# Patient Record
Sex: Male | Born: 1955 | Race: Black or African American | Hispanic: No | Marital: Married | Smoking: Former smoker
Health system: Southern US, Community
[De-identification: ages and names within clinical notes are randomized; demographics above are authoritative.]

## PROBLEM LIST (undated history)

## (undated) DIAGNOSIS — M109 Gout, unspecified: Secondary | ICD-10-CM

## (undated) DIAGNOSIS — J45909 Unspecified asthma, uncomplicated: Secondary | ICD-10-CM

## (undated) DIAGNOSIS — K449 Diaphragmatic hernia without obstruction or gangrene: Secondary | ICD-10-CM

## (undated) DIAGNOSIS — J189 Pneumonia, unspecified organism: Secondary | ICD-10-CM

## (undated) DIAGNOSIS — I739 Peripheral vascular disease, unspecified: Secondary | ICD-10-CM

## (undated) DIAGNOSIS — C801 Malignant (primary) neoplasm, unspecified: Secondary | ICD-10-CM

## (undated) DIAGNOSIS — R0600 Dyspnea, unspecified: Secondary | ICD-10-CM

## (undated) DIAGNOSIS — I4891 Unspecified atrial fibrillation: Secondary | ICD-10-CM

## (undated) DIAGNOSIS — M1990 Unspecified osteoarthritis, unspecified site: Secondary | ICD-10-CM

## (undated) HISTORY — PX: HEMORRHOID SURGERY: SHX153

---

## 2019-08-07 ENCOUNTER — Other Ambulatory Visit: Payer: Self-pay

## 2019-08-07 ENCOUNTER — Encounter (HOSPITAL_BASED_OUTPATIENT_CLINIC_OR_DEPARTMENT_OTHER): Payer: Self-pay | Admitting: Emergency Medicine

## 2019-08-07 ENCOUNTER — Emergency Department (HOSPITAL_BASED_OUTPATIENT_CLINIC_OR_DEPARTMENT_OTHER)
Admission: EM | Admit: 2019-08-07 | Discharge: 2019-08-08 | Disposition: A | Discharge status: Home / Self Care | Payer: Medicare Other | Attending: Emergency Medicine | Admitting: Emergency Medicine

## 2019-08-07 ENCOUNTER — Emergency Department (HOSPITAL_BASED_OUTPATIENT_CLINIC_OR_DEPARTMENT_OTHER): Payer: Medicare Other

## 2019-08-07 DIAGNOSIS — R05 Cough: Secondary | ICD-10-CM | POA: Diagnosis not present

## 2019-08-07 DIAGNOSIS — F17290 Nicotine dependence, other tobacco product, uncomplicated: Secondary | ICD-10-CM | POA: Diagnosis not present

## 2019-08-07 DIAGNOSIS — R0602 Shortness of breath: Secondary | ICD-10-CM | POA: Diagnosis present

## 2019-08-07 DIAGNOSIS — R059 Cough, unspecified: Secondary | ICD-10-CM

## 2019-08-07 DIAGNOSIS — J9801 Acute bronchospasm: Secondary | ICD-10-CM

## 2019-08-07 HISTORY — DX: Unspecified asthma, uncomplicated: J45.909

## 2019-08-07 HISTORY — DX: Diaphragmatic hernia without obstruction or gangrene: K44.9

## 2019-08-07 MED ORDER — IPRATROPIUM-ALBUTEROL 0.5-2.5 (3) MG/3ML IN SOLN
3.0000 mL | Freq: Once | RESPIRATORY_TRACT | Status: AC
  Administered 2019-08-07: 3 mL via RESPIRATORY_TRACT
  Filled 2019-08-07: qty 3

## 2019-08-07 MED ORDER — ALBUTEROL SULFATE (2.5 MG/3ML) 0.083% IN NEBU
2.5000 mg | INHALATION_SOLUTION | Freq: Once | RESPIRATORY_TRACT | Status: AC
  Administered 2019-08-07: 2.5 mg via RESPIRATORY_TRACT
  Filled 2019-08-07: qty 3

## 2019-08-07 NOTE — ED Triage Notes (Signed)
Pt reports cough producing white sputum

## 2019-08-07 NOTE — ED Triage Notes (Signed)
Pt c/o shortness of breath. Pt has hx of asthma

## 2019-08-08 DIAGNOSIS — J9801 Acute bronchospasm: Secondary | ICD-10-CM | POA: Diagnosis not present

## 2019-08-08 MED ORDER — ALBUTEROL SULFATE HFA 108 (90 BASE) MCG/ACT IN AERS
2.0000 | INHALATION_SPRAY | RESPIRATORY_TRACT | Status: DC | PRN
  Administered 2019-08-08: 2 via RESPIRATORY_TRACT
  Filled 2019-08-08: qty 6.7

## 2019-08-08 NOTE — ED Provider Notes (Signed)
[facility] Provider Note: Georgena Spurling, MD, FACEP  CSN: [id number] MRN: [id number] ARRIVAL: 08/07/19 at [place]: [place] [facility]   HISTORY OF PRESENT ILLNESS  08/08/19 1:26 AM Kalvin Buss is a 64 y.o. male with a history of asthma.  He is here with shortness of breath and wheezing since yesterday.  Symptoms are moderate.  He has used his albuterol inhaler without relief.  He is also had a cough productive of white sputum.  He denies nasal congestion or fever.  He has some discomfort in his throat which he states feels like a knot.  He was given a nebulizer treatment prior to my evaluation with significant improvement.  He is also complaining of generalized pain which he rates as an 8 out of 10.   Past Medical History:  Diagnosis Date  . Asthma   . Hiatal hernia     History reviewed. No pertinent surgical history.  No family history on file.  Social History   Tobacco Use  . Smoking status: Current Some Day Smoker  . Smokeless tobacco: Never Used  Substance Use Topics  . Alcohol use: Yes  . Drug use: Never    Prior to Admission medications   Medication Sig Start Date End Date Taking? Authorizing Provider  atorvastatin (LIPITOR) 40 MG tablet Take by mouth. 01/23/18  Yes [provider]  albuterol (ACCUNEB) 0.63 MG/3ML nebulizer solution Inhale into the lungs.    [provider]  diltiazem (CARDIZEM) 30 MG tablet Take 30 mg by mouth 2 (two) times daily. 07/04/19   [provider]  esomeprazole (NEXIUM) 40 MG capsule Take 40 mg by mouth daily. 06/03/19   [provider]    Allergies Patient has no known allergies.   REVIEW OF SYSTEMS  Negative except as noted here or in the History of Present Illness.   PHYSICAL EXAMINATION  Initial Vital Signs Blood pressure 128/73, pulse 86, temperature 98.3 F (36.8 C), temperature source Oral, resp. rate 20, height 5\' 6"  (1.676 m), weight  97.5 kg, SpO2 98 %.  Examination General: Well-developed, well-nourished male in no acute distress; appearance consistent with age of record HENT: normocephalic; atraumatic; no pharyngeal erythema or exudate Eyes: pupils equal, round and reactive to light; extraocular muscles intact; arcus senilis bilaterally; left medial corneal pterygium Neck: supple; no lymphadenopathy Heart: regular rate and rhythm Lungs: clear to auscultation bilaterally Abdomen: soft; nondistended; nontender; bowel sounds present Extremities: No deformity; full range of motion Neurologic: Awake, alert and oriented; motor function intact in all extremities and symmetric; no facial droop Skin: Warm and dry Psychiatric: Normal mood and affect   RESULTS  Summary of this visit's results, reviewed and interpreted by myself:   EKG Interpretation  Date/Time:  Thursday August 07 2019 23:08:55 EDT Ventricular Rate:  93 PR Interval:    QRS Duration: 76 QT Interval:  337 QTC Calculation: 420 R Axis:   21 Text Interpretation: Sinus rhythm Normal ECG No previous ECGs available Confirmed by Jamon Hayhurst [phone]) on 08/07/2019 11:18:47 PM      Laboratory Studies: No results found for this or any previous visit (from the past 24 hour(s)). Imaging Studies: DG Chest 2 View  Result Date: 08/07/2019 CLINICAL DATA:  Short of breath, asthma EXAM: CHEST - 2 VIEW COMPARISON:  07/03/2019 FINDINGS: Frontal and lateral views of the chest demonstrate an unremarkable cardiac silhouette. Chronic eventration of the right hemidiaphragm. Chronic scarring at the left lung base. No airspace disease,  effusion, or pneumothorax. No acute bony abnormalities. IMPRESSION: 1. No acute intrathoracic process. Electronically Signed   By: Randa Ngo M.D.   On: 08/07/2019 23:44    ED COURSE and MDM  Nursing notes, initial and subsequent vitals signs, including pulse oximetry, reviewed and interpreted by myself.  Vitals:   08/07/19 2304 08/07/19 2306  08/07/19 2319 08/08/19 0100  BP:  123/74  128/73  Pulse:  91  86  Resp:  (!) 21  20  Temp: 98.3 F (36.8 C)     TempSrc: Oral     SpO2:  97% 95% 98%  Weight:  97.5 kg    Height:  5\' 6"  (1.676 m)     Medications  albuterol (VENTOLIN HFA) 108 (90 Base) MCG/ACT inhaler 2 puff (has no administration in time range)  albuterol (PROVENTIL) (2.5 MG/3ML) 0.083% nebulizer solution 2.5 mg (2.5 mg Nebulization Given 08/07/19 2319)  ipratropium-albuterol (DUONEB) 0.5-2.5 (3) MG/3ML nebulizer solution 3 mL (3 mLs Nebulization Given 08/07/19 2319)   We will give patient a new albuterol inhaler as his has only 4 puffs left.  We will also provide him with an AeroChamber and instructed him in its use.  He was advised to take Mucinex DM over-the-counter as needed for cough.   PROCEDURES  Procedures   ED DIAGNOSES     ICD-10-CM   1. Acute bronchospasm  J98.01   2. Cough  R05        Hortence Charter, Jenny Reichmann, MD 08/08/19 [phone]

## 2020-01-27 ENCOUNTER — Encounter (HOSPITAL_BASED_OUTPATIENT_CLINIC_OR_DEPARTMENT_OTHER): Payer: Self-pay | Admitting: *Deleted

## 2020-01-27 ENCOUNTER — Emergency Department (HOSPITAL_BASED_OUTPATIENT_CLINIC_OR_DEPARTMENT_OTHER)
Admission: EM | Admit: 2020-01-27 | Discharge: 2020-01-28 | Disposition: A | Discharge status: Home / Self Care | Payer: Medicare Other | Attending: Emergency Medicine | Admitting: Emergency Medicine

## 2020-01-27 ENCOUNTER — Other Ambulatory Visit: Payer: Self-pay

## 2020-01-27 DIAGNOSIS — L259 Unspecified contact dermatitis, unspecified cause: Secondary | ICD-10-CM | POA: Diagnosis not present

## 2020-01-27 DIAGNOSIS — R21 Rash and other nonspecific skin eruption: Secondary | ICD-10-CM | POA: Diagnosis present

## 2020-01-27 DIAGNOSIS — F17200 Nicotine dependence, unspecified, uncomplicated: Secondary | ICD-10-CM | POA: Diagnosis not present

## 2020-01-27 DIAGNOSIS — J45909 Unspecified asthma, uncomplicated: Secondary | ICD-10-CM | POA: Diagnosis not present

## 2020-01-27 MED ORDER — TRIAMCINOLONE ACETONIDE 0.1 % EX CREA
1.0000 | TOPICAL_CREAM | Freq: Two times a day (BID) | CUTANEOUS | 0 refills | Status: DC
Start: 2020-01-27 — End: 2021-05-09

## 2020-01-27 MED ORDER — BACITRACIN ZINC 500 UNIT/GM EX OINT
1.0000 | TOPICAL_OINTMENT | Freq: Two times a day (BID) | CUTANEOUS | 0 refills | Status: DC
Start: 2020-01-27 — End: 2021-03-18

## 2020-01-27 NOTE — ED Provider Notes (Signed)
TIME SEEN: 11:37 PM  CHIEF COMPLAINT: Rash to neck  HPI: Martin Kaiser is a 64 year old male with history of asthma who presents to the emergency department with a rash to the right anterior neck that started after shaving with a new razor and using a new body wash.  States it is pruritic in nature and it is burning.  No drainage.  No fever.  No other rash.  ROS: See HPI Constitutional: no fever  Eyes: no drainage  ENT: no runny nose   Cardiovascular:  no chest pain  Resp: no SOB  GI: no vomiting GU: no dysuria Integumentary: no rash  Allergy: no hives  Musculoskeletal: no leg swelling  Neurological: no slurred speech ROS otherwise negative  PAST MEDICAL HISTORY/PAST SURGICAL HISTORY:  Past Medical History:  Diagnosis Date  . Asthma   . Hiatal hernia     MEDICATIONS:  Prior to Admission medications   Medication Sig Start Date End Date Taking? Authorizing Provider  albuterol (ACCUNEB) 0.63 MG/3ML nebulizer solution Inhale into the lungs.    [provider]  atorvastatin (LIPITOR) 40 MG tablet Take by mouth. 01/23/18   [provider]  diltiazem (CARDIZEM) 30 MG tablet Take 30 mg by mouth 2 (two) times daily. 07/04/19   [provider]  esomeprazole (NEXIUM) 40 MG capsule Take 40 mg by mouth daily. 06/03/19   [provider]    ALLERGIES:  No Known Allergies  SOCIAL HISTORY:  Social History   Tobacco Use  . Smoking status: Current Some Day Smoker  . Smokeless tobacco: Never Used  Substance Use Topics  . Alcohol use: Yes    FAMILY HISTORY: No family history on file.  EXAM: BP (!) 152/87   Pulse 91   Temp 98.3 F (36.8 C) (Oral)   Resp 16   Ht 5\' 6"  (1.676 m)   Wt 98 kg   SpO2 95%   BMI 34.86 kg/m  CONSTITUTIONAL: Alert and oriented and responds appropriately to questions. Well-appearing; well-nourished HEAD: Normocephalic EYES: Conjunctivae clear, pupils appear equal, EOM appear intact ENT: normal nose; moist mucous  membranes NECK: Supple, normal ROM CARD: RRR; S1 and S2 appreciated; no murmurs, no clicks, no rubs, no gallops RESP: Normal chest excursion without splinting or tachypnea; breath sounds clear and equal bilaterally; no wheezes, no rhonchi, no rales, no hypoxia or respiratory distress, speaking full sentences ABD/GI: Normal bowel sounds; non-distended; soft, non-tender, no rebound, no guarding, no peritoneal signs, no hepatosplenomegaly BACK:  The back appears normal EXT: Normal ROM in all joints; no deformity noted, no edema; no cyanosis SKIN: Normal color for age and race; warm; contact dermatitis noted to the anterior neck with some small superficial open areas from scratching with no sign of surrounding warmth, drainage, fluctuance, induration; no petechiae or purpura, no blisters or desquamation, no vesicular lesions NEURO: Moves all extremities equally PSYCH: The Martin Kaiser's mood and manner are appropriate.   MEDICAL DECISION MAKING: Martin Kaiser here with area of contact dermatitis.  Likely from using new razor blade and new body wash.  Even there are some small areas that are open superficially due to scratching, will place on bacitracin and also triamcinolone.  Recommended going back to his old body wash and trying not to scratch this area as to not introduce infection.  No sign of infection currently.  No other systemic symptoms.  Will discharge.  At this time, I do not feel there is any life-threatening condition present. I have reviewed, interpreted and discussed all results (EKG, imaging,  lab, urine as appropriate) and exam findings with Martin Kaiser/family. I have reviewed nursing notes and appropriate previous records.  I feel the Martin Kaiser is safe to be discharged home without further emergent workup and can continue workup as an outpatient as needed. Discussed usual and customary return precautions. Martin Kaiser/family verbalize understanding and are comfortable with this plan.  Outpatient follow-up has  been provided as needed. All questions have been answered.   Martin Kaiser was evaluated in Emergency Department on 01/27/2020 for the symptoms described in the history of present illness. Martin Kaiser was evaluated in the context of the global COVID-19 pandemic, which necessitated consideration that the Martin Kaiser might be at risk for infection with the SARS-CoV-2 virus that causes COVID-19. Institutional protocols and algorithms that pertain to the evaluation of patients at risk for COVID-19 are in a state of rapid change based on information released by regulatory bodies including the CDC and federal and state organizations. These policies and algorithms were followed during the Martin Kaiser's care in the ED.      Arianna Delsanto, Delice Bison, DO 01/28/20 0001

## 2020-01-27 NOTE — ED Triage Notes (Signed)
C/o rash to right side of neck x 2 days

## 2020-08-19 ENCOUNTER — Emergency Department (HOSPITAL_BASED_OUTPATIENT_CLINIC_OR_DEPARTMENT_OTHER): Payer: Medicare Other

## 2020-08-19 ENCOUNTER — Emergency Department (HOSPITAL_BASED_OUTPATIENT_CLINIC_OR_DEPARTMENT_OTHER)
Admission: EM | Admit: 2020-08-19 | Discharge: 2020-08-19 | Disposition: A | Discharge status: Home / Self Care | Payer: Medicare Other | Attending: Emergency Medicine | Admitting: Emergency Medicine

## 2020-08-19 ENCOUNTER — Encounter (HOSPITAL_BASED_OUTPATIENT_CLINIC_OR_DEPARTMENT_OTHER): Payer: Self-pay | Admitting: *Deleted

## 2020-08-19 ENCOUNTER — Other Ambulatory Visit (HOSPITAL_BASED_OUTPATIENT_CLINIC_OR_DEPARTMENT_OTHER): Payer: Self-pay

## 2020-08-19 ENCOUNTER — Other Ambulatory Visit: Payer: Self-pay

## 2020-08-19 DIAGNOSIS — Z20822 Contact with and (suspected) exposure to covid-19: Secondary | ICD-10-CM | POA: Insufficient documentation

## 2020-08-19 DIAGNOSIS — J45909 Unspecified asthma, uncomplicated: Secondary | ICD-10-CM | POA: Diagnosis not present

## 2020-08-19 DIAGNOSIS — Z7951 Long term (current) use of inhaled steroids: Secondary | ICD-10-CM | POA: Diagnosis not present

## 2020-08-19 DIAGNOSIS — J449 Chronic obstructive pulmonary disease, unspecified: Secondary | ICD-10-CM | POA: Insufficient documentation

## 2020-08-19 DIAGNOSIS — R0602 Shortness of breath: Secondary | ICD-10-CM | POA: Insufficient documentation

## 2020-08-19 DIAGNOSIS — R059 Cough, unspecified: Secondary | ICD-10-CM | POA: Insufficient documentation

## 2020-08-19 DIAGNOSIS — F17200 Nicotine dependence, unspecified, uncomplicated: Secondary | ICD-10-CM | POA: Diagnosis not present

## 2020-08-19 LAB — RESP PANEL BY RT-PCR (FLU A&B, COVID) ARPGX2
Influenza A by PCR: NEGATIVE
Influenza B by PCR: NEGATIVE
SARS Coronavirus 2 by RT PCR: NEGATIVE

## 2020-08-19 MED ORDER — ALBUTEROL SULFATE HFA 108 (90 BASE) MCG/ACT IN AERS
2.0000 | INHALATION_SPRAY | Freq: Once | RESPIRATORY_TRACT | Status: AC
  Administered 2020-08-19: 2 via RESPIRATORY_TRACT
  Filled 2020-08-19: qty 6.7

## 2020-08-19 MED ORDER — PREDNISONE 50 MG PO TABS
60.0000 mg | ORAL_TABLET | Freq: Once | ORAL | Status: AC
  Administered 2020-08-19: 60 mg via ORAL
  Filled 2020-08-19: qty 1

## 2020-08-19 MED ORDER — PREDNISONE 10 MG PO TABS
20.0000 mg | ORAL_TABLET | Freq: Every day | ORAL | 0 refills | Status: DC
  Filled 2020-08-19: qty 20, 10d supply, fill #0

## 2020-08-19 NOTE — ED Provider Notes (Signed)
[facility] EMERGENCY DEPARTMENT Provider Note   CSN: [id number] Arrival date & time: 08/19/20  1331     History Chief Complaint  Patient presents with   Cough    Martin Kaiser is a 65 y.o. male.  HPI  Patient presents for shortness of breath x1 week.  He has COPD and takes medication for it.  He had says he has missed a few doses of the medication here and there.  He is also been having a productive cough this past week.  He denies any fevers, chills, congestion, chest pain, leg swelling.  No recent steroids for exacerbations.  Has not been hospitalized for COPD this year.  He is COVID vaccinated, denies any known exposures.  Last time using his inhaler was before his visit today.  Past Medical History:  Diagnosis Date   Asthma    Hiatal hernia     There are no problems to display for this patient.   History reviewed. No pertinent surgical history.     History reviewed. No pertinent family history.  Social History   Tobacco Use   Smoking status: Some Days   Smokeless tobacco: Never  Substance Use Topics   Alcohol use: Yes   Drug use: Never    Home Medications Prior to Admission medications   Medication Sig Start Date End Date Taking? Authorizing Provider  albuterol (ACCUNEB) 0.63 MG/3ML nebulizer solution Inhale into the lungs.    [provider]  atorvastatin (LIPITOR) 40 MG tablet Take by mouth. 01/23/18   [provider]  bacitracin ointment Apply 1 application topically 2 (two) times daily. 01/27/20   Ward, Delice Bison, DO  diltiazem (CARDIZEM) 30 MG tablet Take 30 mg by mouth 2 (two) times daily. 07/04/19   [provider]  esomeprazole (NEXIUM) 40 MG capsule Take 40 mg by mouth daily. 06/03/19   [provider]  triamcinolone (KENALOG) 0.1 % Apply 1 application topically 2 (two) times daily. 01/27/20   Ward, Delice Bison, DO    Allergies    Patient has no known allergies.  Review of Systems   Review of  Systems  Constitutional:  Negative for fever.  HENT:  Negative for congestion.   Respiratory:  Positive for cough and shortness of breath.   Cardiovascular:  Negative for chest pain and leg swelling.   Physical Exam Updated Vital Signs BP 121/70   Pulse 93   Temp 98.4 F (36.9 C) (Oral)   Resp 18   Ht 5\' 6"  (1.676 m)   Wt 95.3 kg   SpO2 96%   BMI 33.89 kg/m   Physical Exam Vitals and nursing note reviewed. Exam conducted with a chaperone present.  Constitutional:      General: He is not in acute distress.    Appearance: Normal appearance.  HENT:     Head: Normocephalic and atraumatic.  Eyes:     General: No scleral icterus.    Extraocular Movements: Extraocular movements intact.     Pupils: Pupils are equal, round, and reactive to light.  Cardiovascular:     Rate and Rhythm: Normal rate and regular rhythm.  Pulmonary:     Effort: Pulmonary effort is normal.  Skin:    Coloration: Skin is not jaundiced.  Neurological:     Mental Status: He is alert. Mental status is at baseline.     Coordination: Coordination normal.    ED Results / Procedures / Treatments   Labs (all labs ordered are listed, but only abnormal results  are displayed) Labs Reviewed  RESP PANEL BY RT-PCR (FLU A&B, COVID) [id number]    EKG None  Radiology No results found.  Procedures Procedures   Medications Ordered in ED Medications - No data to display  ED Course  I have reviewed the triage vital signs and the nursing notes.  Pertinent labs & imaging results that were available during my care of the patient were reviewed by me and considered in my medical decision making (see chart for details).    MDM Rules/Calculators/A&P                          Physical exam is reassuring, lungs are clear to auscultation bilaterally.  No leg swelling.  Will order the patient some albuterol while here in the ED and give him a dose of oral steroids.  I suspect this is a bronchospasm from his COPD due to  medical noncompliance.  We will test him for COVID as well.  Patient is COVID-negative.  Chest x-ray does not show any signs of pneumonia.  We will start him on prednisone 40 mg daily for the next 5 days.  I advised him to follow-up with his primary care doctor in a week to make sure things are improving.  Discussed return precautions.  Patient verbalizes agreement understanding with the plan.   Final Clinical Impression(s) / ED Diagnoses Final diagnoses:  None    Rx / DC Orders ED Discharge Orders     None        Sherrill Raring, PA-C 08/19/20 [place], [place], DO 08/19/20 1820

## 2020-08-19 NOTE — Discharge Instructions (Addendum)
You were seen today in the ED for coughing and shortness of breath.  Your COVID test was negative, your chest x-ray did not show any signs of pneumonia.  Please take 20 mg of prednisone twice daily (40 mg daily).  Do this for 5 days.  I like you to schedule a follow-up appointment with your primary care doctor for next week to make sure things are improving.  If condition change or worsen please return back to the ED for further evaluation.

## 2020-08-19 NOTE — ED Triage Notes (Signed)
C/o pro cough , congestion SOb x 1 week

## 2020-08-19 NOTE — ED Notes (Addendum)
Assessed PT on RA with complaint of cough and SOB. Sp02 95%, RR 18, BBS clear, diminished in  bases (mild, end exp whz in throat). PT appear SOB and states he has been x 1 week.

## 2020-10-23 ENCOUNTER — Emergency Department (HOSPITAL_BASED_OUTPATIENT_CLINIC_OR_DEPARTMENT_OTHER): Payer: Medicare Other

## 2020-10-23 ENCOUNTER — Encounter (HOSPITAL_BASED_OUTPATIENT_CLINIC_OR_DEPARTMENT_OTHER): Payer: Self-pay | Admitting: *Deleted

## 2020-10-23 ENCOUNTER — Other Ambulatory Visit: Payer: Self-pay

## 2020-10-23 ENCOUNTER — Emergency Department (HOSPITAL_BASED_OUTPATIENT_CLINIC_OR_DEPARTMENT_OTHER)
Admission: EM | Admit: 2020-10-23 | Discharge: 2020-10-24 | Disposition: A | Discharge status: Home / Self Care | Payer: Medicare Other | Attending: Emergency Medicine | Admitting: Emergency Medicine

## 2020-10-23 DIAGNOSIS — J45909 Unspecified asthma, uncomplicated: Secondary | ICD-10-CM | POA: Diagnosis not present

## 2020-10-23 DIAGNOSIS — N2889 Other specified disorders of kidney and ureter: Secondary | ICD-10-CM | POA: Insufficient documentation

## 2020-10-23 DIAGNOSIS — F17210 Nicotine dependence, cigarettes, uncomplicated: Secondary | ICD-10-CM | POA: Diagnosis not present

## 2020-10-23 DIAGNOSIS — R0602 Shortness of breath: Secondary | ICD-10-CM | POA: Diagnosis not present

## 2020-10-23 DIAGNOSIS — J441 Chronic obstructive pulmonary disease with (acute) exacerbation: Secondary | ICD-10-CM

## 2020-10-23 DIAGNOSIS — Z79899 Other long term (current) drug therapy: Secondary | ICD-10-CM | POA: Insufficient documentation

## 2020-10-23 DIAGNOSIS — R059 Cough, unspecified: Secondary | ICD-10-CM | POA: Insufficient documentation

## 2020-10-23 DIAGNOSIS — J9809 Other diseases of bronchus, not elsewhere classified: Secondary | ICD-10-CM

## 2020-10-23 HISTORY — DX: Gout, unspecified: M10.9

## 2020-10-23 HISTORY — DX: Unspecified atrial fibrillation: I48.91

## 2020-10-23 LAB — BASIC METABOLIC PANEL
Anion gap: 8 (ref 5–15)
BUN: 14 mg/dL (ref 8–23)
CO2: 24 mmol/L (ref 22–32)
Calcium: 8.9 mg/dL (ref 8.9–10.3)
Chloride: 108 mmol/L (ref 98–111)
Creatinine, Ser: 1.17 mg/dL (ref 0.61–1.24)
GFR, Estimated: >60 mL/min (ref >60)
Glucose, Bld: 86 mg/dL (ref 70–99)
Potassium: 4 mmol/L (ref 3.5–5.1)
Sodium: 140 mmol/L (ref 135–145)

## 2020-10-23 LAB — CBC
HCT: 41.6 % (ref 39.0–52.0)
Hemoglobin: 13.9 g/dL (ref 13.0–17.0)
MCH: 29.2 pg (ref 26.0–34.0)
MCHC: 33.4 g/dL (ref 30.0–36.0)
MCV: 87.4 fL (ref 80.0–100.0)
Platelets: 281 10*3/uL (ref 150–400)
RBC: 4.76 MIL/uL (ref 4.22–5.81)
RDW: 13.8 % (ref 11.5–15.5)
WBC: 7.3 10*3/uL (ref 4.0–10.5)
nRBC: 0 % (ref 0.0–0.2)

## 2020-10-23 NOTE — ED Triage Notes (Signed)
Pt reports increased SOB x 3 weeks, worse with activity. Also reports cough, body aches, and chest pain

## 2020-10-23 NOTE — ED Notes (Signed)
Patient transported to X-ray 

## 2020-10-24 ENCOUNTER — Emergency Department (HOSPITAL_BASED_OUTPATIENT_CLINIC_OR_DEPARTMENT_OTHER): Payer: Medicare Other

## 2020-10-24 ENCOUNTER — Encounter (HOSPITAL_BASED_OUTPATIENT_CLINIC_OR_DEPARTMENT_OTHER): Payer: Self-pay | Admitting: Emergency Medicine

## 2020-10-24 DIAGNOSIS — R0602 Shortness of breath: Secondary | ICD-10-CM | POA: Diagnosis not present

## 2020-10-24 LAB — TROPONIN I (HIGH SENSITIVITY)
Troponin I (High Sensitivity): 2 ng/L (ref <18)
Troponin I (High Sensitivity): 3 ng/L (ref <18)

## 2020-10-24 LAB — BRAIN NATRIURETIC PEPTIDE: B Natriuretic Peptide: 5.8 pg/mL (ref 0.0–100.0)

## 2020-10-24 MED ORDER — AZITHROMYCIN 250 MG PO TABS
500.0000 mg | ORAL_TABLET | Freq: Once | ORAL | Status: AC
  Administered 2020-10-24: 500 mg via ORAL
  Filled 2020-10-24: qty 2

## 2020-10-24 MED ORDER — IPRATROPIUM-ALBUTEROL 0.5-2.5 (3) MG/3ML IN SOLN: 3.0000 mL | Freq: Once | RESPIRATORY_TRACT | Status: AC

## 2020-10-24 MED ORDER — AZITHROMYCIN 250 MG PO TABS: 250.0000 mg | ORAL_TABLET | Freq: Every day | ORAL | 0 refills | Status: DC

## 2020-10-24 MED ORDER — METHYLPREDNISOLONE SODIUM SUCC 125 MG IJ SOLR
125.0000 mg | Freq: Once | INTRAMUSCULAR | Status: AC
  Administered 2020-10-24: 125 mg via INTRAVENOUS
  Filled 2020-10-24: qty 2

## 2020-10-24 MED ORDER — PREDNISONE 10 MG PO TABS: 20.0000 mg | ORAL_TABLET | Freq: Two times a day (BID) | ORAL | 0 refills | Status: DC

## 2020-10-24 MED ORDER — IPRATROPIUM-ALBUTEROL 0.5-2.5 (3) MG/3ML IN SOLN
RESPIRATORY_TRACT | Status: AC
  Administered 2020-10-24: 3 mL via RESPIRATORY_TRACT
  Filled 2020-10-24: qty 3

## 2020-10-24 MED ORDER — FUROSEMIDE 10 MG/ML IJ SOLN
20.0000 mg | Freq: Once | INTRAMUSCULAR | Status: AC
  Administered 2020-10-24: 20 mg via INTRAVENOUS
  Filled 2020-10-24: qty 2

## 2020-10-24 NOTE — Discharge Instructions (Addendum)
Begin taking Zithromax and prednisone as prescribed.  Continue nebulizer treatments at home every 4 hours as needed for wheezing/cough.  Radiology will call you to schedule a renal ultrasound to further evaluate the abnormal finding on your CT scan.  Please follow these results up with your primary doctor after the study has been completed.

## 2020-10-24 NOTE — ED Provider Notes (Signed)
[facility] EMERGENCY DEPARTMENT Provider Note   CSN: [id number] Arrival date & time: 10/23/20  2245     History Chief Complaint  Patient presents with   Shortness of Breath    Martin Kaiser is a 65 y.o. male.  Patient is a 65 year old male with past medical history of atrial fibrillation, asthma, and gout.  He presents today with a 3-week history of cough and worsening breathing.  He describes cough that occurs throughout the day and is productive of clear sputum.  He denies any fevers or chills.  He denies experiencing any chest pain.  The history is provided by the patient.  Shortness of Breath Severity:  Moderate Onset quality:  Gradual Duration:  3 weeks Timing:  Constant Progression:  Worsening Chronicity:  New Relieved by:  Nothing Worsened by:  Coughing Ineffective treatments:  None tried     Past Medical History:  Diagnosis Date   Asthma    Atrial fibrillation ([place])    Gout    Hiatal hernia     There are no problems to display for this patient.   History reviewed. No pertinent surgical history.     History reviewed. No pertinent family history.  Social History   Tobacco Use   Smoking status: Some Days    Types: Cigarettes   Smokeless tobacco: Never  Substance Use Topics   Alcohol use: Yes   Drug use: Never    Home Medications Prior to Admission medications   Medication Sig Start Date End Date Taking? Authorizing Provider  albuterol (ACCUNEB) 0.63 MG/3ML nebulizer solution Inhale into the lungs.    [provider]  atorvastatin (LIPITOR) 40 MG tablet Take by mouth. 01/23/18   [provider]  bacitracin ointment Apply 1 application topically 2 (two) times daily. 01/27/20   Ward, Delice Bison, DO  diltiazem (CARDIZEM) 30 MG tablet Take 30 mg by mouth 2 (two) times daily. 07/04/19   [provider]  esomeprazole (NEXIUM) 40 MG capsule Take 40 mg by mouth daily. 06/03/19   [provider]  predniSONE  (DELTASONE) 10 MG tablet Take 2 tablets (20 mg total) by mouth daily. 08/19/20   Sherrill Raring, PA-C  triamcinolone (KENALOG) 0.1 % Apply 1 application topically 2 (two) times daily. 01/27/20   Ward, Delice Bison, DO    Allergies    Iodine and Shellfish allergy  Review of Systems   Review of Systems  Respiratory:  Positive for shortness of breath.   All other systems reviewed and are negative.  Physical Exam Updated Vital Signs BP 121/74   Pulse 81   Temp 98.6 F (37 C) (Oral)   Resp 19   Ht '5\' 7"'$  (1.702 m)   SpO2 98%   BMI 32.89 kg/m   Physical Exam Vitals and nursing note reviewed.  Constitutional:      General: He is not in acute distress.    Appearance: He is well-developed. He is not diaphoretic.  HENT:     Head: Normocephalic and atraumatic.  Cardiovascular:     Rate and Rhythm: Normal rate and regular rhythm.     Heart sounds: No murmur heard.   No friction rub.  Pulmonary:     Effort: Pulmonary effort is normal. No respiratory distress.     Breath sounds: Examination of the right-middle field reveals rhonchi. Examination of the left-middle field reveals rhonchi. Rhonchi present. No wheezing or rales.  Abdominal:     General: Bowel sounds are normal. There is no distension.  Palpations: Abdomen is soft.     Tenderness: There is no abdominal tenderness.  Musculoskeletal:        General: Normal range of motion.     Cervical back: Normal range of motion and neck supple.  Skin:    General: Skin is warm and dry.  Neurological:     Mental Status: He is alert and oriented to person, place, and time.     Coordination: Coordination normal.    ED Results / Procedures / Treatments   Labs (all labs ordered are listed, but only abnormal results are displayed) Labs Reviewed  BASIC METABOLIC PANEL  CBC  BRAIN NATRIURETIC PEPTIDE  TROPONIN I (HIGH SENSITIVITY)  TROPONIN I (HIGH SENSITIVITY)    EKG None  Radiology DG Chest 2 View  Result Date:  10/23/2020 CLINICAL DATA:  Chest pain with shortness of breath. EXAM: CHEST - 2 VIEW COMPARISON:  Chest x-ray 04/16/2018. chest x-ray 08/19/2020. FINDINGS: There is minimal atelectasis in the left lung base. The lungs are otherwise clear. There is no pleural effusion or pneumothorax. The cardiothymic silhouette is within normal limits. No fractures are seen. IMPRESSION: No active cardiopulmonary disease. Electronically Signed   By: Ronney Asters M.D.   On: 10/23/2020 23:20    Procedures Procedures   Medications Ordered in ED Medications  furosemide (LASIX) injection 20 mg (has no administration in time range)    ED Course  I have reviewed the triage vital signs and the nursing notes.  Pertinent labs & imaging results that were available during my care of the patient were reviewed by me and considered in my medical decision making (see chart for details).    MDM Rules/Calculators/A&P  Patient is a 65 year old male with history of chronic bronchitis.  He presents with cough and coughing up clear phlegm.  This has been worsening over the past several days.  While in the exam room, patient has been coughing up copious amounts of clear liquid that looks similar to saliva, but he states this is coming from his lungs.  His chest x-ray shows no acute process, so this was followed up with a CT scan to rule out pneumonia or mass.  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  I suspect that this is the etiology of the bronchorrhea.  Remainder of his laboratory studies are unremarkable and cardiac work-up is negative.  Patient was given Solu-Medrol, a dose of Lasix, nebulizer treatment, and seems to be feeling better.  At this point, he will be discharged with prednisone, Zithromax, and return as needed.  CT scan also shows a 3.8 cm mass off of the left kidney for which an outpatient ultrasound has been recommended.  This will be scheduled  and patient to follow results up with primary doctor.  Final Clinical Impression(s) / ED Diagnoses Final diagnoses:  None    Rx / DC Orders ED Discharge Orders     None        Veryl Speak, MD 10/24/20 [phone]

## 2020-10-27 ENCOUNTER — Ambulatory Visit (HOSPITAL_BASED_OUTPATIENT_CLINIC_OR_DEPARTMENT_OTHER): Admission: RE | Admit: 2020-10-27 | Payer: Medicare Other | Source: Ambulatory Visit

## 2020-11-06 ENCOUNTER — Ambulatory Visit (HOSPITAL_BASED_OUTPATIENT_CLINIC_OR_DEPARTMENT_OTHER): Admission: RE | Admit: 2020-11-06 | Payer: Medicare Other | Source: Ambulatory Visit

## 2020-11-13 ENCOUNTER — Ambulatory Visit (HOSPITAL_BASED_OUTPATIENT_CLINIC_OR_DEPARTMENT_OTHER): Admission: RE | Admit: 2020-11-13 | Payer: Medicare Other | Source: Ambulatory Visit

## 2020-11-15 ENCOUNTER — Other Ambulatory Visit: Payer: Self-pay

## 2020-11-15 ENCOUNTER — Ambulatory Visit (HOSPITAL_BASED_OUTPATIENT_CLINIC_OR_DEPARTMENT_OTHER)
Admission: RE | Admit: 2020-11-15 | Discharge: 2020-11-15 | Disposition: A | Discharge status: Home / Self Care | Payer: Medicare Other | Source: Ambulatory Visit | Attending: Emergency Medicine | Admitting: Emergency Medicine

## 2020-11-15 DIAGNOSIS — N2889 Other specified disorders of kidney and ureter: Secondary | ICD-10-CM | POA: Insufficient documentation

## 2020-11-16 ENCOUNTER — Telehealth (HOSPITAL_BASED_OUTPATIENT_CLINIC_OR_DEPARTMENT_OTHER): Payer: Self-pay | Admitting: Emergency Medicine

## 2020-12-17 ENCOUNTER — Encounter (HOSPITAL_COMMUNITY): Payer: Self-pay | Admitting: Radiology

## 2020-12-22 ENCOUNTER — Telehealth: Payer: Self-pay

## 2020-12-22 NOTE — Telephone Encounter (Signed)
Pt. Reports he received an incidental letter "about my kidney xray. I was told in the ED I have a spot on my kidney." States he called Dr. Jeanice Lim "and they said they don't know anything about it." Instructed pt. To call back and tell them he was told he needed a specialist for "this spot." Verbalizes understanding.

## 2021-01-17 ENCOUNTER — Encounter (HOSPITAL_BASED_OUTPATIENT_CLINIC_OR_DEPARTMENT_OTHER): Payer: Self-pay | Admitting: *Deleted

## 2021-01-17 ENCOUNTER — Emergency Department (HOSPITAL_BASED_OUTPATIENT_CLINIC_OR_DEPARTMENT_OTHER)
Admission: EM | Admit: 2021-01-17 | Discharge: 2021-01-17 | Disposition: A | Discharge status: Home / Self Care | Payer: Medicare Other | Attending: Emergency Medicine | Admitting: Emergency Medicine

## 2021-01-17 DIAGNOSIS — Z87891 Personal history of nicotine dependence: Secondary | ICD-10-CM | POA: Diagnosis not present

## 2021-01-17 DIAGNOSIS — J45909 Unspecified asthma, uncomplicated: Secondary | ICD-10-CM | POA: Diagnosis not present

## 2021-01-17 DIAGNOSIS — R0602 Shortness of breath: Secondary | ICD-10-CM | POA: Diagnosis present

## 2021-01-17 DIAGNOSIS — J441 Chronic obstructive pulmonary disease with (acute) exacerbation: Secondary | ICD-10-CM | POA: Diagnosis not present

## 2021-01-17 MED ORDER — IPRATROPIUM-ALBUTEROL 0.5-2.5 (3) MG/3ML IN SOLN
3.0000 mL | Freq: Once | RESPIRATORY_TRACT | Status: AC
  Administered 2021-01-17: 3 mL via RESPIRATORY_TRACT
  Filled 2021-01-17: qty 3

## 2021-01-17 MED ORDER — PREDNISONE 50 MG PO TABS
60.0000 mg | ORAL_TABLET | Freq: Once | ORAL | Status: AC
  Administered 2021-01-17: 60 mg via ORAL
  Filled 2021-01-17: qty 1

## 2021-01-17 MED ORDER — ALBUTEROL SULFATE (2.5 MG/3ML) 0.083% IN NEBU
5.0000 mg | INHALATION_SOLUTION | Freq: Once | RESPIRATORY_TRACT | Status: AC
  Administered 2021-01-17: 5 mg via RESPIRATORY_TRACT
  Filled 2021-01-17: qty 6

## 2021-01-17 MED ORDER — ALBUTEROL SULFATE (2.5 MG/3ML) 0.083% IN NEBU
5.0000 mg | INHALATION_SOLUTION | Freq: Once | RESPIRATORY_TRACT | Status: AC
  Administered 2021-01-17: 2.5 mg via RESPIRATORY_TRACT
  Filled 2021-01-17: qty 6

## 2021-01-17 MED ORDER — PREDNISONE 50 MG PO TABS: ORAL_TABLET | ORAL | 0 refills | Status: DC

## 2021-01-17 NOTE — ED Triage Notes (Signed)
Sob since yesterday. Inhaler is not helping. States he normally comes to the ER to get a nebulized Albuterol Tx when he gets sob.

## 2021-01-18 NOTE — ED Provider Notes (Signed)
[facility] Provider Note   CSN: [id number] Arrival date & time: 01/17/21  2106     History Chief Complaint  Patient presents with   Shortness of Breath    Martin Kaiser is a 65 y.o. male.  The history is provided by the patient.  Shortness of Breath Severity:  Moderate Onset quality:  Gradual Duration:  1 day Timing:  Constant Progression:  Unchanged Chronicity:  Recurrent Relieved by:  Nothing Worsened by:  Nothing Associated symptoms: cough   Associated symptoms: no abdominal pain, no chest pain, no fever, no hemoptysis, no syncope and no vomiting   Risk factors: tobacco use   Patient reports history of COPD presents with cough and shortness of breath.  Patient reports over the past day he had increasing wheezing, chest tightness and shortness of breath.  He has had cough without any mucus production.  No hemoptysis.   He reports he quit smoking about 1 month ago Past Medical History:  Diagnosis Date   Asthma    Atrial fibrillation ([place])    Gout    Hiatal hernia      Social History   Tobacco Use   Smoking status: Former    Types: Cigarettes   Smokeless tobacco: Never  Substance Use Topics   Alcohol use: Not Currently   Drug use: Never    Home Medications Prior to Admission medications   Medication Sig Start Date End Date Taking? Authorizing Provider  predniSONE (DELTASONE) 50 MG tablet 1 tablet PO QD X4 days 01/17/21  Yes Ripley Fraise, MD  albuterol (ACCUNEB) 0.63 MG/3ML nebulizer solution Inhale into the lungs.    [provider]  atorvastatin (LIPITOR) 40 MG tablet Take by mouth. 01/23/18   [provider]  bacitracin ointment Apply 1 application topically 2 (two) times daily. 01/27/20   Ward, Delice Bison, DO  diltiazem (CARDIZEM) 30 MG tablet Take 30 mg by mouth 2 (two) times daily. 07/04/19   [provider]  esomeprazole (NEXIUM) 40 MG capsule Take 40 mg by mouth daily. 06/03/19    [provider]  triamcinolone (KENALOG) 0.1 % Apply 1 application topically 2 (two) times daily. 01/27/20   Ward, Delice Bison, DO    Allergies    Iodine and Shellfish allergy  Review of Systems   Review of Systems  Constitutional:  Negative for fever.  Respiratory:  Positive for cough and shortness of breath. Negative for hemoptysis.   Cardiovascular:  Negative for chest pain, leg swelling and syncope.  Gastrointestinal:  Negative for abdominal pain and vomiting.  Neurological:  Negative for syncope.  All other systems reviewed and are negative.  Physical Exam Updated Vital Signs BP 122/66   Pulse 81   Temp 98 F (36.7 C) (Oral)   Resp (!) 25   Ht 1.702 m (5\' 7" )   Wt 95.3 kg   SpO2 93%   BMI 32.91 kg/m   Physical Exam CONSTITUTIONAL: Well developed/well nourished HEAD: Normocephalic/atraumatic EYES: EOMI/PERRL ENMT: Mucous membranes moist NECK: supple no meningeal signs SPINE/BACK:entire spine nontender CV: S1/S2 noted, no murmurs/rubs/gallops noted LUNGS: Scattered wheeze, no acute distress, no crackles ABDOMEN: soft, nontender NEURO: Pt is awake/alert/appropriate, moves all extremitiesx4.  No facial droop.   EXTREMITIES: pulses normal/equal, full ROM, no lower extremity edema SKIN: warm, color normal PSYCH: no abnormalities of mood noted, alert and oriented to situation  ED Results / Procedures / Treatments   Labs (all labs ordered are listed, but only abnormal results are displayed) Labs Reviewed -  No data to display  EKG None  Radiology No results found.  Procedures Procedures   Medications Ordered in ED Medications  ipratropium-albuterol (DUONEB) 0.5-2.5 (3) MG/3ML nebulizer solution 3 mL (3 mLs Nebulization Given 01/17/21 2219)  albuterol (PROVENTIL) (2.5 MG/3ML) 0.083% nebulizer solution 5 mg (2.5 mg Nebulization Given 01/17/21 2219)  albuterol (PROVENTIL) (2.5 MG/3ML) 0.083% nebulizer solution 5 mg (5 mg Nebulization Given 01/17/21 2233)   albuterol (PROVENTIL) (2.5 MG/3ML) 0.083% nebulizer solution 5 mg (5 mg Nebulization Given 01/17/21 2250)  predniSONE (DELTASONE) tablet 60 mg (60 mg Oral Given 01/17/21 2339)    ED Course  I have reviewed the triage vital signs and the nursing notes.     MDM Rules/Calculators/A&P                           By the time of my evaluation patient has already been given multiple nebs and feels improved Wheezing is nearly resolved.  He is in no distress.  He is requesting discharge. He is also requesting short course of prednisone.  Advised close follow-up with his PCP as he may need medication changes for his Advair.  He already has albuterol at home for rescue treatments Patient is appropriate for discharge home. Final Clinical Impression(s) / ED Diagnoses Final diagnoses:  COPD exacerbation ([place])    Rx / DC Orders ED Discharge Orders          Ordered    predniSONE (DELTASONE) 50 MG tablet        01/17/21 2325             Ripley Fraise, MD 01/18/21 [id number]

## 2021-02-14 ENCOUNTER — Other Ambulatory Visit: Payer: Self-pay | Admitting: Urology

## 2021-02-17 ENCOUNTER — Emergency Department (HOSPITAL_BASED_OUTPATIENT_CLINIC_OR_DEPARTMENT_OTHER): Payer: Medicare Other

## 2021-02-17 ENCOUNTER — Other Ambulatory Visit: Payer: Self-pay

## 2021-02-17 ENCOUNTER — Other Ambulatory Visit (HOSPITAL_BASED_OUTPATIENT_CLINIC_OR_DEPARTMENT_OTHER): Payer: Self-pay

## 2021-02-17 ENCOUNTER — Encounter (HOSPITAL_BASED_OUTPATIENT_CLINIC_OR_DEPARTMENT_OTHER): Payer: Self-pay | Admitting: *Deleted

## 2021-02-17 ENCOUNTER — Emergency Department (HOSPITAL_BASED_OUTPATIENT_CLINIC_OR_DEPARTMENT_OTHER)
Admission: EM | Admit: 2021-02-17 | Discharge: 2021-02-17 | Disposition: A | Discharge status: Home / Self Care | Payer: Medicare Other | Attending: Emergency Medicine | Admitting: Emergency Medicine

## 2021-02-17 DIAGNOSIS — J441 Chronic obstructive pulmonary disease with (acute) exacerbation: Secondary | ICD-10-CM | POA: Insufficient documentation

## 2021-02-17 DIAGNOSIS — J45909 Unspecified asthma, uncomplicated: Secondary | ICD-10-CM | POA: Insufficient documentation

## 2021-02-17 DIAGNOSIS — Z20822 Contact with and (suspected) exposure to covid-19: Secondary | ICD-10-CM | POA: Insufficient documentation

## 2021-02-17 DIAGNOSIS — R0602 Shortness of breath: Secondary | ICD-10-CM | POA: Diagnosis present

## 2021-02-17 LAB — CBC WITH DIFFERENTIAL/PLATELET
Abs Immature Granulocytes: 0.02 10*3/uL (ref 0.00–0.07)
Basophils Absolute: 0 10*3/uL (ref 0.0–0.1)
Basophils Relative: 1 %
Eosinophils Absolute: 0.5 10*3/uL (ref 0.0–0.5)
Eosinophils Relative: 8 %
HCT: 41.7 % (ref 39.0–52.0)
Hemoglobin: 14.1 g/dL (ref 13.0–17.0)
Immature Granulocytes: 0 %
Lymphocytes Relative: 22 %
Lymphs Abs: 1.4 10*3/uL (ref 0.7–4.0)
MCH: 28.7 pg (ref 26.0–34.0)
MCHC: 33.8 g/dL (ref 30.0–36.0)
MCV: 84.9 fL (ref 80.0–100.0)
Monocytes Absolute: 0.4 10*3/uL (ref 0.1–1.0)
Monocytes Relative: 7 %
Neutro Abs: 3.8 10*3/uL (ref 1.7–7.7)
Neutrophils Relative %: 62 %
Platelets: 248 10*3/uL (ref 150–400)
RBC: 4.91 MIL/uL (ref 4.22–5.81)
RDW: 13.5 % (ref 11.5–15.5)
WBC: 6.2 10*3/uL (ref 4.0–10.5)
nRBC: 0 % (ref 0.0–0.2)

## 2021-02-17 LAB — BASIC METABOLIC PANEL
Anion gap: 8 (ref 5–15)
BUN: 11 mg/dL (ref 8–23)
CO2: 24 mmol/L (ref 22–32)
Calcium: 8.8 mg/dL — ABNORMAL LOW (ref 8.9–10.3)
Chloride: 108 mmol/L (ref 98–111)
Creatinine, Ser: 1.16 mg/dL (ref 0.61–1.24)
GFR, Estimated: >60 mL/min (ref >60)
Glucose, Bld: 97 mg/dL (ref 70–99)
Potassium: 4 mmol/L (ref 3.5–5.1)
Sodium: 140 mmol/L (ref 135–145)

## 2021-02-17 LAB — RESP PANEL BY RT-PCR (FLU A&B, COVID) ARPGX2
Influenza A by PCR: NEGATIVE
Influenza B by PCR: NEGATIVE
SARS Coronavirus 2 by RT PCR: NEGATIVE

## 2021-02-17 MED ORDER — METHYLPREDNISOLONE SODIUM SUCC 125 MG IJ SOLR
125.0000 mg | Freq: Once | INTRAMUSCULAR | Status: AC
  Administered 2021-02-17: 125 mg via INTRAVENOUS
  Filled 2021-02-17: qty 2

## 2021-02-17 MED ORDER — IPRATROPIUM-ALBUTEROL 0.5-2.5 (3) MG/3ML IN SOLN: 3.0000 mL | Freq: Once | RESPIRATORY_TRACT | Status: AC

## 2021-02-17 MED ORDER — IPRATROPIUM-ALBUTEROL 0.5-2.5 (3) MG/3ML IN SOLN
RESPIRATORY_TRACT | Status: AC
  Administered 2021-02-17: 3 mL via RESPIRATORY_TRACT
  Filled 2021-02-17: qty 3

## 2021-02-17 MED ORDER — ALBUTEROL SULFATE (2.5 MG/3ML) 0.083% IN NEBU
INHALATION_SOLUTION | RESPIRATORY_TRACT | Status: AC
  Administered 2021-02-17: 2.5 mg via RESPIRATORY_TRACT
  Filled 2021-02-17: qty 3

## 2021-02-17 MED ORDER — AZITHROMYCIN 250 MG PO TABS
250.0000 mg | ORAL_TABLET | Freq: Every day | ORAL | 0 refills | Status: DC
  Filled 2021-02-17: qty 6, 5d supply, fill #0

## 2021-02-17 MED ORDER — ALBUTEROL SULFATE (2.5 MG/3ML) 0.083% IN NEBU: 2.5000 mg | INHALATION_SOLUTION | Freq: Once | RESPIRATORY_TRACT | Status: AC

## 2021-02-17 MED ORDER — PREDNISONE 20 MG PO TABS
60.0000 mg | ORAL_TABLET | Freq: Every day | ORAL | 0 refills | Status: AC
  Filled 2021-02-17: qty 12, 4d supply, fill #0

## 2021-02-17 NOTE — ED Provider Notes (Signed)
[facility] DEPARTMENT Provider Note   CSN: [id number] Arrival date & time: 02/17/21  1038     History  Chief Complaint  Patient presents with   Shortness of Breath    Martin Kaiser is a 66 y.o. male.  The history is provided by the patient.  Shortness of Breath Severity:  Moderate Onset quality:  Gradual Duration:  2 days Timing:  Intermittent Progression:  Waxing and waning Chronicity:  New Context: URI   Context comment:  COPD symptoms last two days with cough and sputum Relieved by:  Inhaler Worsened by:  Deep breathing and exertion Associated symptoms: cough and sputum production   Associated symptoms: no abdominal pain, no chest pain, no claudication, no diaphoresis, no ear pain, no fever, no headaches, no hemoptysis, no neck pain, no PND, no rash, no sore throat, no syncope, no swollen glands, no vomiting and no wheezing   Risk factors: no hx of PE/DVT       Home Medications Prior to Admission medications   Medication Sig Start Date End Date Taking? Authorizing Provider  azithromycin (ZITHROMAX) 250 MG tablet Take 2 tablets by mouth together on day 1, then take 1 tablet daily until finished 02/17/21  Yes Tayvien Kane, DO  predniSONE (DELTASONE) 20 MG tablet Take 3 tablets (60 mg total) by mouth daily for 4 days. 02/17/21 02/21/21 Yes Jemia Fata, DO  albuterol (ACCUNEB) 0.63 MG/3ML nebulizer solution Inhale into the lungs.    [provider]  atorvastatin (LIPITOR) 40 MG tablet Take by mouth. 01/23/18   [provider]  bacitracin ointment Apply 1 application topically 2 (two) times daily. 01/27/20   Ward, Delice Bison, DO  diltiazem (CARDIZEM) 30 MG tablet Take 30 mg by mouth 2 (two) times daily. 07/04/19   [provider]  esomeprazole (NEXIUM) 40 MG capsule Take 40 mg by mouth daily. 06/03/19   [provider]  triamcinolone (KENALOG) 0.1 % Apply 1 application topically 2 (two) times daily. 01/27/20    Ward, Delice Bison, DO      Allergies    Iodine and Shellfish allergy    Review of Systems   Review of Systems  Constitutional:  Negative for chills, diaphoresis and fever.  HENT:  Negative for ear pain and sore throat.   Eyes:  Negative for pain and visual disturbance.  Respiratory:  Positive for cough, sputum production and shortness of breath. Negative for hemoptysis and wheezing.   Cardiovascular:  Negative for chest pain, palpitations, claudication, syncope and PND.  Gastrointestinal:  Negative for abdominal pain and vomiting.  Genitourinary:  Negative for dysuria and hematuria.  Musculoskeletal:  Negative for arthralgias, back pain and neck pain.  Skin:  Negative for color change and rash.  Neurological:  Negative for seizures, syncope and headaches.  All other systems reviewed and are negative.  Physical Exam Updated Vital Signs BP 131/77    Pulse 95    Temp 98.1 F (36.7 C) (Oral)    Resp 19    Ht 5\' 7"  (1.702 m)    Wt 95.4 kg    SpO2 94%    BMI 32.94 kg/m  Physical Exam Vitals and nursing note reviewed.  Constitutional:      General: He is not in acute distress.    Appearance: He is well-developed. He is not ill-appearing.  HENT:     Head: Normocephalic and atraumatic.     Mouth/Throat:     Mouth: Mucous membranes are moist.  Eyes:  Extraocular Movements: Extraocular movements intact.     Conjunctiva/sclera: Conjunctivae normal.     Pupils: Pupils are equal, round, and reactive to light.  Cardiovascular:     Rate and Rhythm: Normal rate and regular rhythm.     Pulses: Normal pulses.     Heart sounds: Normal heart sounds. No murmur heard. Pulmonary:     Effort: Pulmonary effort is normal. Tachypnea present. No respiratory distress.     Breath sounds: Decreased breath sounds and wheezing present.  Abdominal:     Palpations: Abdomen is soft.     Tenderness: There is no abdominal tenderness.  Musculoskeletal:        General: No swelling. Normal range of motion.      Cervical back: Normal range of motion and neck supple.     Right lower leg: No edema.     Left lower leg: No edema.  Skin:    General: Skin is warm and dry.     Capillary Refill: Capillary refill takes less than 2 seconds.  Neurological:     General: No focal deficit present.     Mental Status: He is alert.  Psychiatric:        Mood and Affect: Mood normal.    ED Results / Procedures / Treatments   Labs (all labs ordered are listed, but only abnormal results are displayed) Labs Reviewed  BASIC METABOLIC PANEL - Abnormal; Notable for the following components:      Result Value   Calcium 8.8 (*)    All other components within normal limits  RESP PANEL BY RT-PCR (FLU A&B, COVID) ARPGX2  CBC WITH DIFFERENTIAL/PLATELET    EKG EKG Interpretation  Date/Time:  Thursday February 17 2021 11:02:19 EST Ventricular Rate:  101 PR Interval:  154 QRS Duration: 76 QT Interval:  330 QTC Calculation: 428 R Axis:   38 Text Interpretation: Sinus tachycardia Confirmed by Lennice Sites ([id number]) on 02/17/2021 11:05:19 AM  Radiology DG Chest Portable 1 View  Result Date: 02/17/2021 CLINICAL DATA:  Shortness of breath, COPD, coughing with thick white sputum, headache EXAM: PORTABLE CHEST 1 VIEW COMPARISON:  Portable exam 1057 hours compared 10/23/2020 FINDINGS: Upper normal size of cardiac silhouette. Mediastinal contours and pulmonary vascularity normal for AP semi erect technique. Atherosclerotic calcification aorta. Subsegmental atelectasis LEFT base. Lungs otherwise clear. No pleural effusion, pneumothorax or definite infiltrate. Osseous structures unremarkable. IMPRESSION: Subsegmental atelectasis LEFT base. Aortic Atherosclerosis (ICD10-I70.0). Electronically Signed   By: Lavonia Dana M.D.   On: 02/17/2021 11:04    Procedures Procedures    Medications Ordered in ED Medications  ipratropium-albuterol (DUONEB) 0.5-2.5 (3) MG/3ML nebulizer solution 3 mL (3 mLs Nebulization Given 02/17/21  1106)  albuterol (PROVENTIL) (2.5 MG/3ML) 0.083% nebulizer solution 2.5 mg (2.5 mg Nebulization Given 02/17/21 1106)  methylPREDNISolone sodium succinate (SOLU-MEDROL) 125 mg/2 mL injection 125 mg (125 mg Intravenous Given 02/17/21 1122)    ED Course/ Medical Decision Making/ A&P                           Medical Decision Making  Declyn Delsol is a 66 year old male with history of asthma who presents to the ED with cough, sputum production for last 2 days.  Vital signs overall unremarkable.  Has diminished breath sounds and some wheezing.  Has had some relief with inhaler.  Differential diagnosis includes asthma exacerbation versus pneumonia versus viral process.  Will give duo nebs, IV Solu-Medrol and check basic labs, chest x-ray, COVID testing.  He is not having any chest pain and doubt acute coronary syndrome.  Does not have any pulmonary embolism risk factors and no concern for PE.  Wells criteria 0.  12:16 PM patient reevaluated feeling much better.  Lab work and imaging interpreted by myself shows no pneumonia, no pneumothorax.  No significant leukocytosis, anemia, electrolyte abnormality, kidney injury.  Overall lab work is unremarkable.  Suspect asthma exacerbation.  Will prescribe steroids, antibiotics.  Discharged in good condition.  This chart was dictated using voice recognition software.  Despite best efforts to proofread,  errors can occur which can change the documentation meaning.        Final Clinical Impression(s) / ED Diagnoses Final diagnoses:  COPD exacerbation ([place])    Rx / DC Orders ED Discharge Orders          Ordered    predniSONE (DELTASONE) 20 MG tablet  Daily        02/17/21 1211    azithromycin (ZITHROMAX) 250 MG tablet  Daily        02/17/21 1211              CuratoloQuita Skye, DO 02/17/21 1216

## 2021-02-17 NOTE — ED Triage Notes (Signed)
Presents with Shortness of Breath, has hx of COPD. Has normal coughs with white sputum, thick. Has HA as well

## 2021-02-17 NOTE — ED Notes (Signed)
Patient seen before triage, HR 104, SPO2 94%, SHOB @ rest, BBS decreased with scattered exp. Wheezes.  Last home treatment @ 6am.

## 2021-03-03 NOTE — Progress Notes (Signed)
Covid test on ______________________ Come thru main entrance at [facility] long.  Have a seat in the lobby on the right as you come thru the door.  Call [phone] and let them know your name andyou are here for covid testin                Christ Fullenwider  03/03/2021   Your procedure is scheduled on:    Report   03/17/2021. to [facility] Main  Entrance   Report to admitting at   [phone]     Call this number if you have problems the morning of surgery [phone]    Remember: Do not eat food , candy gum or mints :After Midnight. You may have clear liquids from midnight until __ 0415aaam.   Clear liquid diet the day before surgery.    CLEAR LIQUID DIET   Foods Allowed                                                                       Coffee and tea, regular and decaf                              Plain Jell-O any favor except red or purple                                            Fruit ices (not with fruit pulp)                                      Iced Popsicles                                     Carbonated beverages, regular and diet                                    Cranberry, grape and apple juices Sports drinks like Gatorade Lightly seasoned clear broth or consume(fat free) Sugar   _____________________________________________________________________    BRUSH YOUR TEETH MORNING OF SURGERY AND RINSE YOUR MOUTH OUT, NO CHEWING GUM CANDY OR MINTS.     Take these medicines the morning of surgery with A SIP OF WATER:  inhalers as usu and bring, nexium flonase, gabapentin, nebulizer if needed, metoprolol   DO NOT TAKE ANY DIABETIC MEDICATIONS DAY OF YOUR SURGERY                               You may not have any metal on your body including hair pins and              piercings  Do not wear jewelry, make-up, lotions, powders or perfumes, deodorant             Do not wear nail polish on your fingernails.  Do not  shave  48 hours prior to surgery.               Men may shave face and neck.   Do not bring valuables to the hospital. [place].  Contacts, dentures or bridgework may not be worn into surgery.  Leave suitcase in the car. After surgery it may be brought to your room.     Patients discharged the day of surgery will not be allowed to drive home. IF YOU ARE HAVING SURGERY AND GOING HOME THE SAME DAY, YOU MUST HAVE AN ADULT TO DRIVE YOU HOME AND BE WITH YOU FOR 24 HOURS. YOU MAY GO HOME BY TAXI OR UBER OR ORTHERWISE, BUT AN ADULT MUST ACCOMPANY YOU HOME AND STAY WITH YOU FOR 24 HOURS.  Name and phone number of your driver:  Special Instructions: N/A              Please read over the following fact sheets you were given: _____________________________________________________________________  [facility] - Preparing for Surgery Before surgery, you can play an important role.  Because skin is not sterile, your skin needs to be as free of germs as possible.  You can reduce the number of germs on your skin by washing with CHG (chlorahexidine gluconate) soap before surgery.  CHG is an antiseptic cleaner which kills germs and bonds with the skin to continue killing germs even after washing. Please DO NOT use if you have an allergy to CHG or antibacterial soaps.  If your skin becomes reddened/irritated stop using the CHG and inform your nurse when you arrive at Short Stay. Do not shave (including legs and underarms) for at least 48 hours prior to the first CHG shower.  You may shave your face/neck. Please follow these instructions carefully:  1.  Shower with CHG Soap the night before surgery and the  morning of Surgery.  2.  If you choose to wash your hair, wash your hair first as usual with your  normal  shampoo.  3.  After you shampoo, rinse your hair and body thoroughly to remove the  shampoo.                           4.  Use CHG as you would any other liquid soap.  You can apply chg directly  to the  skin and wash                       Gently with a scrungie or clean washcloth.  5.  Apply the CHG Soap to your body ONLY FROM THE NECK DOWN.   Do not use on face/ open                           Wound or open sores. Avoid contact with eyes, ears mouth and genitals (private parts).                       Wash face,  Genitals (private parts) with your normal soap.             6.  Wash thoroughly, paying special attention to the area where your surgery  will be performed.  7.  Thoroughly rinse your body with warm water from the neck down.  8.  DO  NOT shower/wash with your normal soap after using and rinsing off  the CHG Soap.                9.  Pat yourself dry with a clean towel.            10.  Wear clean pajamas.            11.  Place clean sheets on your bed the night of your first shower and do not  sleep with pets. Day of Surgery : Do not apply any lotions/deodorants the morning of surgery.  Please wear clean clothes to the hospital/surgery center.  FAILURE TO FOLLOW THESE INSTRUCTIONS MAY RESULT IN THE CANCELLATION OF YOUR SURGERY PATIENT SIGNATURE_________________________________  NURSE SIGNATURE__________________________________  ________________________________________________________________________

## 2021-03-03 NOTE — Progress Notes (Addendum)
Anesthesia Review:am   PCP: DR McMullin  Cardiologist : DR Orlinda Blalock 01/28/21 - LOV  Chest x-ray : 1V- 02/17/21  11/03/20- cT - cHEST  EKG :02/17/2021  Echo :01/28/21 ( in 01/28/21- ov note)  Stress test: Cardiac Cath :  Activity level: cannot do a flight of stairs without getting some shortness of breath per pt  Sleep Study/ CPAP : none  Fasting Blood Sugar :      / Checks Blood Sugar -- times a day:   Blood Thinner/ Instructions /Last Dose: ASA / Instructions/ Last Dose :   81 mg aspirin  02/17/2021- pt in ED with COPD exacerbation.  PT reports without symptoms currenlty and feeling much better. Some sob on occasion.   Covid test on 03/15/21 at 0915am

## 2021-03-08 ENCOUNTER — Encounter (HOSPITAL_COMMUNITY): Payer: Self-pay

## 2021-03-08 ENCOUNTER — Other Ambulatory Visit: Payer: Self-pay

## 2021-03-08 ENCOUNTER — Encounter (HOSPITAL_COMMUNITY)
Admission: RE | Admit: 2021-03-08 | Discharge: 2021-03-08 | Disposition: A | Discharge status: Home / Self Care | Payer: Medicare Other | Source: Ambulatory Visit | Attending: Urology | Admitting: Urology

## 2021-03-08 VITALS — BP 145/89 | HR 76 | Temp 98.2°F | Resp 18 | Ht 66.5 in | Wt 214.5 lb

## 2021-03-08 DIAGNOSIS — C641 Malignant neoplasm of right kidney, except renal pelvis: Secondary | ICD-10-CM | POA: Insufficient documentation

## 2021-03-08 DIAGNOSIS — J45909 Unspecified asthma, uncomplicated: Secondary | ICD-10-CM | POA: Insufficient documentation

## 2021-03-08 DIAGNOSIS — I4891 Unspecified atrial fibrillation: Secondary | ICD-10-CM | POA: Insufficient documentation

## 2021-03-08 DIAGNOSIS — I517 Cardiomegaly: Secondary | ICD-10-CM | POA: Insufficient documentation

## 2021-03-08 DIAGNOSIS — Z01812 Encounter for preprocedural laboratory examination: Secondary | ICD-10-CM | POA: Diagnosis present

## 2021-03-08 DIAGNOSIS — I739 Peripheral vascular disease, unspecified: Secondary | ICD-10-CM | POA: Insufficient documentation

## 2021-03-08 DIAGNOSIS — Z01818 Encounter for other preprocedural examination: Secondary | ICD-10-CM

## 2021-03-08 HISTORY — DX: Peripheral vascular disease, unspecified: I73.9

## 2021-03-08 HISTORY — DX: Pneumonia, unspecified organism: J18.9

## 2021-03-08 HISTORY — DX: Malignant (primary) neoplasm, unspecified: C80.1

## 2021-03-08 HISTORY — DX: Dyspnea, unspecified: R06.00

## 2021-03-08 HISTORY — DX: Unspecified osteoarthritis, unspecified site: M19.90

## 2021-03-08 LAB — BASIC METABOLIC PANEL
Anion gap: 7 (ref 5–15)
BUN: 12 mg/dL (ref 8–23)
CO2: 25 mmol/L (ref 22–32)
Calcium: 8.6 mg/dL — ABNORMAL LOW (ref 8.9–10.3)
Chloride: 107 mmol/L (ref 98–111)
Creatinine, Ser: 1.16 mg/dL (ref 0.61–1.24)
GFR, Estimated: >60 mL/min (ref >60)
Glucose, Bld: 91 mg/dL (ref 70–99)
Potassium: 3.7 mmol/L (ref 3.5–5.1)
Sodium: 139 mmol/L (ref 135–145)

## 2021-03-08 LAB — CBC
HCT: 41.2 % (ref 39.0–52.0)
Hemoglobin: 13.3 g/dL (ref 13.0–17.0)
MCH: 28.5 pg (ref 26.0–34.0)
MCHC: 32.3 g/dL (ref 30.0–36.0)
MCV: 88.4 fL (ref 80.0–100.0)
Platelets: 192 10*3/uL (ref 150–400)
RBC: 4.66 MIL/uL (ref 4.22–5.81)
RDW: 13.8 % (ref 11.5–15.5)
WBC: 6.2 10*3/uL (ref 4.0–10.5)
nRBC: 0 % (ref 0.0–0.2)

## 2021-03-09 NOTE — Anesthesia Preprocedure Evaluation (Addendum)
Anesthesia Evaluation  Patient identified by MRN, date of birth, ID band Patient awake    Reviewed: Allergy & Precautions, NPO status , Patient's Chart, lab work & pertinent test results  Airway Mallampati: I  TM Distance: >3 FB Neck ROM: Full    Dental  (+) Edentulous Upper, Edentulous Lower   Pulmonary asthma , former smoker,    Pulmonary exam normal        Cardiovascular + Peripheral Vascular Disease  + dysrhythmias Atrial Fibrillation  Rhythm:Regular Rate:Normal     Neuro/Psych negative neurological ROS  negative psych ROS   GI/Hepatic Neg liver ROS, GERD  Medicated,  Endo/Other  negative endocrine ROS  Renal/GU negative Renal ROS     Musculoskeletal  (+) Arthritis ,   Abdominal Normal abdominal exam  (+)   Peds  Hematology negative hematology ROS (+)   Anesthesia Other Findings   Reproductive/Obstetrics                           Anesthesia Physical Anesthesia Plan  ASA: 3  Anesthesia Plan: General   Post-op Pain Management:    Induction: Intravenous  PONV Risk Score and Plan: 3 and Ondansetron, Dexamethasone and Midazolam  Airway Management Planned: Oral ETT  Additional Equipment: None  Intra-op Plan:   Post-operative Plan: Extubation in OR  Informed Consent: I have reviewed the patients History and Physical, chart, labs and discussed the procedure including the risks, benefits and alternatives for the proposed anesthesia with the patient or authorized representative who has indicated his/her understanding and acceptance.     Dental advisory given  Plan Discussed with: CRNA  Anesthesia Plan Comments: (See PAT note 03/08/2021, Konrad Felix Ward, PA-C  - 2 IV's)     Anesthesia Quick Evaluation

## 2021-03-09 NOTE — Progress Notes (Signed)
Anesthesia Chart Review  Case: [id number] Date/Time: 03/17/21 0700   Procedure: XI ROBOTIC ASSITED PARTIAL NEPHRECTOMY (Left)   Anesthesia type: General   Pre-op diagnosis: LEFT ENAL NEOPLASM   Location: [facility] ROOM 03 / [facility]   Surgeons: Raynelle Bring, MD       DISCUSSION:66 y.o. former smoker with h/o asthma, atrial fibrillation, PVD, left renal neoplasm scheduled for above procedure 03/17/2021 with Dr. Raynelle Bring.   Echo 01/28/2021 with EF 60-65% with no valvular problems.   Normal stress test 01/28/2021.   Anticipate pt can proceed with planned procedure barring acute status change.   VS: BP (!) 145/89    Pulse 76    Temp 36.8 C (Oral)    Resp 18    Ht 5' 6.5" (1.689 m)    Wt 97.3 kg    SpO2 98%    BMI 34.10 kg/m   PROVIDERS: Vincenza Hews, NP is PCP    LABS: Labs reviewed: Acceptable for surgery. (all labs ordered are listed, but only abnormal results are displayed)  Labs Reviewed  BASIC METABOLIC PANEL - Abnormal; Notable for the following components:      Result Value   Calcium 8.6 (*)    All other components within normal limits  CBC  TYPE AND SCREEN     IMAGES:   EKG: 02/17/2021 Rate 101 bpm  Sinus tachycardia   CV: Stress Test 01/28/2021 Conclusion:   Normal treadmill exercise stress test without evidence of ischemia  Echo 01/28/2021 SUMMARY   There is borderline concentric left ventricular hypertrophy.  The left ventricular wall motion is normal.  Left ventricular systolic function is normal.  LV ejection fraction = 60-65%.  The left ventricular diastolic function is normal for age, with normal  left atrial pressure.  The right ventricle is normal in size and function.  The atria are normal in size.  There is no significant valvular stenosis or regurgitation.  The IVC is normal in size with an inspiratory collapse of greater then  50%, suggesting normal right atrial pressure.   Past Medical History:  Diagnosis Date   Arthritis    Asthma     Atrial fibrillation ([place])    Cancer ([place])    Dyspnea    ON OCCAS   Gout    Peripheral vascular disease ([place])    Pneumonia     History reviewed. No pertinent surgical history.  MEDICATIONS:  albuterol (VENTOLIN HFA) 108 (90 Base) MCG/ACT inhaler   aspirin EC 81 MG tablet   azithromycin (ZITHROMAX) 250 MG tablet   bacitracin ointment   esomeprazole (NEXIUM) 40 MG capsule   Famotidine (ZANTAC 360 MAX ST PO)   fluticasone (FLONASE) 50 MCG/ACT nasal spray   gabapentin (NEURONTIN) 100 MG capsule   ipratropium-albuterol (DUONEB) 0.5-2.5 (3) MG/3ML SOLN   meloxicam (MOBIC) 15 MG tablet   metoprolol tartrate (LOPRESSOR) 25 MG tablet   polyvinyl alcohol (LIQUIFILM TEARS) 1.4 % ophthalmic solution   pravastatin (PRAVACHOL) 40 MG tablet   sucralfate (CARAFATE) 1 g tablet   triamcinolone (KENALOG) 0.1 %   WIXELA INHUB 250-50 MCG/ACT AEPB   No current facility-administered medications for this encounter.    Martin Felix Ward, PA-C [facility]Surgical [facility] [phone]

## 2021-03-15 ENCOUNTER — Encounter (HOSPITAL_COMMUNITY)
Admission: RE | Admit: 2021-03-15 | Discharge: 2021-03-15 | Disposition: A | Discharge status: Home / Self Care | Payer: Medicare Other | Source: Ambulatory Visit | Attending: Urology | Admitting: Urology

## 2021-03-15 ENCOUNTER — Other Ambulatory Visit: Payer: Self-pay

## 2021-03-15 DIAGNOSIS — Z20822 Contact with and (suspected) exposure to covid-19: Secondary | ICD-10-CM | POA: Insufficient documentation

## 2021-03-15 DIAGNOSIS — Z01812 Encounter for preprocedural laboratory examination: Secondary | ICD-10-CM | POA: Diagnosis not present

## 2021-03-15 DIAGNOSIS — Z01818 Encounter for other preprocedural examination: Secondary | ICD-10-CM

## 2021-03-15 LAB — SARS CORONAVIRUS 2 (TAT 6-24 HRS): SARS Coronavirus 2: NEGATIVE

## 2021-03-16 NOTE — H&P (Signed)
Office Visit Report     03/08/2021   --------------------------------------------------------------------------------   Martin Kaiser  MRN: [id number]  DOB: 09/06/1955, 66 year old Male  SSN:    PRIMARY CARE:    REFERRING:  Luvenia Redden  PROVIDER:  Raynelle Bring, M.D.  TREATING:  Mcarthur Rossetti, [place]  LOCATION:  [facility] [phone]     --------------------------------------------------------------------------------   CC/HPI: Pt presents today for pre-operative history and physical exam in anticipation of robotic assisted lap left partial nephrectomy by Dr. Alinda Money on 03/17/21. He is doing well and is without complaint. He has not had any additional issues with asthma exacerbations or cardiac arrhythmias.   He received cardiac clearance from Dr. Tamala Julian who requested pt be monitored on telemetry peri-op due to his potential afib.    Pt denies F/C, HA, CP, SOB, N/V, diarrhea/constipation, back pain, flank pain, hematuria, and dysuria.    HX:   CC: Left renal neoplasm  Provider requesting consult: Windy Kalata, NP  Primary care physician: Same as above, Dr. Wynelle Fanny   Martin Kaiser is a 66 year old gentleman seen today in consultation for an enhancing left renal mass. He has a history of asthma and was seen in the emergency department in mid September when he had what sounds like an acute asthma attack. He underwent a CT scan of the chest during that evaluation that incidentally detected a possible left upper pole renal mass. He was also incidentally noted to have supraventricular tachycardia at that time. From a cardiac standpoint he did undergo further evaluation and was recently evaluated by Dr. Orlinda Blalock. On review of the records, it appears that he does have a pending stress test and echocardiogram scheduled for the near future to complete his evaluation. He has been back in normal sinus rhythm since the early fall apparently. He did have a  follow-up renal ultrasound in early October that did confirm a solid-appearing left-sided renal mass. Definitive imaging with an MRI of the abdomen with and without contrast was performed on 12/29/2020 and confirmed a 4 x 3.7 cm exophytic enhancing upper pole left renal mass concerning for renal cell carcinoma. His CT scan of the chest from September did not demonstrate any evidence for pulmonary metastases.   Family history of kidney cancer: None.  Family history of ESRD: None.   Imaging: MRI (12/29/20)  Side of renal neoplasm: Left  Size of renal neoplasm: 4.0 cm  Location of renal neoplasm: Left upper pole  Exophytic or endophytic: Exophytic  Renal nephrometry score: 4x   Renal artery anatomy: Single renal artery  Renal vein anatomy: Single renal vein   Contralateral renal lesions:  Regional lymphadenopathy: None.  Adrenal masses: None.  Renal vein/IVC involvement: No.  Metastatic disease to the abdomen: No.   Chest imaging: CT chest (10/24/20)  LFTs: PENDING   Baseline renal function: Cr 1.1, eGFR > 60 ml/min   PMH: Past medical history is significant for GERD, asthma, hyperlipidemia, paroxysmal AVT/atrial fibrillation.  PSH: No abdominal surgeries.     ALLERGIES: None   MEDICATIONS: Metoprolol Tartrate 25 mg tablet  Pravastatin Sodium 40 mg tablet  Sucralfate 1 gram tablet     Notes: multiple asthma medications  pro air  advair  nebulizer machine  ASA 56m QD  nexium QD    GU PSH: None   NON-GU PSH: None   GU PMH: Left renal neoplasm - 01/11/2021      PMH Notes: GI Dr at [facility] Last endoscopy was  2-3 years ago   Asthma    NON-GU PMH: Acute gastric ulcer with hemorrhage Arrhythmia Arthritis Atrial Fibrillation Hypercholesterolemia    FAMILY HISTORY: 2 sons - Runs in Family 3 daughters - Runs in Family   SOCIAL HISTORY: Marital Status: Married Preferred Language: English; Race: Black or African American Current Smoking Status: Patient does not  smoke anymore. Smoked for 50 years. Smoked 1 pack per day.   Tobacco Use Assessment Completed: Used Tobacco in last 30 days? Does not use smokeless tobacco. Has never drank.  Does not use drugs. Drinks 2 caffeinated drinks per day. Has not had a blood transfusion.     Notes: Quit smoking approx1 month ago  Quit ETOH 3-4 months ago   REVIEW OF SYSTEMS:    GU Review Male:   Patient denies frequent urination, hard to postpone urination, burning/ pain with urination, get up at night to urinate, leakage of urine, stream starts and stops, trouble starting your stream, have to strain to urinate , erection problems, and penile pain.  Gastrointestinal (Upper):   Patient denies nausea, vomiting, and indigestion/ heartburn.  Gastrointestinal (Lower):   Patient denies diarrhea and constipation.  Constitutional:   Patient denies fever, night sweats, weight loss, and fatigue.  Skin:   Patient denies skin rash/ lesion and itching.  Eyes:   Patient denies double vision and blurred vision.  Ears/ Nose/ Throat:   Patient denies sore throat and sinus problems.  Hematologic/Lymphatic:   Patient denies swollen glands and easy bruising.  Cardiovascular:   Patient denies leg swelling and chest pains.  Respiratory:   Patient reports shortness of breath. Patient denies cough.  Endocrine:   Patient denies excessive thirst.  Musculoskeletal:   Patient denies back pain and joint pain.  Neurological:   Patient denies headaches and dizziness.  Psychologic:   Patient denies depression and anxiety.   Notes: SOB due to asthma    VITAL SIGNS:      03/08/2021 03:09 PM  Weight 216 lb / 97.98 kg  Height 66 in / 167.64 cm  BP 119/76 mmHg  Pulse 89 /min  Temperature 97.7 F / 36.5 C  BMI 34.9 kg/m   MULTI-SYSTEM PHYSICAL EXAMINATION:    Constitutional: Well-nourished. No physical deformities. Normally developed. Good grooming.  Neck: Neck symmetrical, not swollen. Normal tracheal position.  Respiratory: Normal  breath sounds. No labored breathing, no use of accessory muscles.   Cardiovascular: Regular rate and rhythm. No murmur, no gallop.   Lymphatic: No enlargement of neck, axillae, groin.  Skin: No paleness, no jaundice, no cyanosis. No lesion, no ulcer, no rash.  Neurologic / Psychiatric: Oriented to time, oriented to place, oriented to person. No depression, no anxiety, no agitation.  Gastrointestinal: No mass, no tenderness, no rigidity, obese abdomen.   Eyes: Normal conjunctivae. Normal eyelids.  Ears, Nose, Mouth, and Throat: Left ear no scars, no lesions, no masses. Right ear no scars, no lesions, no masses. Nose no scars, no lesions, no masses. Normal hearing. Normal lips.  Musculoskeletal: Normal gait and station of head and neck.     Complexity of Data:  Records Review:   Previous Patient Records  Urine Test Review:   Urinalysis   03/08/21  Urinalysis  Urine Appearance Clear   Urine Color Yellow   Urine Glucose Neg mg/dL  Urine Bilirubin Neg mg/dL  Urine Ketones Neg mg/dL  Urine Specific Gravity 1.025   Urine Blood Neg ery/uL  Urine pH 6.0   Urine Protein Neg mg/dL  Urine Urobilinogen  0.2 mg/dL  Urine Nitrites Neg   Urine Leukocyte Esterase Neg leu/uL   PROCEDURES:          Urinalysis Dipstick Dipstick Cont'd  Color: Yellow Bilirubin: Neg mg/dL  Appearance: Clear Ketones: Neg mg/dL  Specific Gravity: 1.025 Blood: Neg ery/uL  pH: 6.0 Protein: Neg mg/dL  Glucose: Neg mg/dL Urobilinogen: 0.2 mg/dL    Nitrites: Neg    Leukocyte Esterase: Neg leu/uL    ASSESSMENT:      ICD-10 Details  1 GU:   Left renal neoplasm - D49.512    PLAN:           Schedule Return Visit/Planned Activity: Keep Scheduled Appointment - Schedule Surgery          Document Letter(s):  Created for Patient: Clinical Summary         Notes:   There are no changes in the patients history or physical exam since last evaluation by Dr. Alinda Money. Pt is scheduled to undergo left RAL partial nephrectomy  on 03/17/21.   All pt's questions were answered to the best of my ability.          Next Appointment:      Next Appointment: 03/17/2021 07:15 AM    Appointment Type: Surgery     Location: [facility] [phone]    Provider: Raynelle Bring, M.D.    Reason for Visit: WL/EXT REC LT RA LAP PARTIAL NEPHRECTOMY WITH AMANDA      * Signed by Mcarthur Rossetti, PA on 03/08/21 at 9:32 PM (EST*

## 2021-03-17 ENCOUNTER — Encounter (HOSPITAL_COMMUNITY): Payer: Self-pay | Admitting: Urology

## 2021-03-17 ENCOUNTER — Encounter (HOSPITAL_COMMUNITY)
Admission: RE | Disposition: A | Discharge status: Home / Self Care | Payer: Self-pay | Source: Home / Self Care | Attending: Urology

## 2021-03-17 ENCOUNTER — Other Ambulatory Visit: Payer: Self-pay

## 2021-03-17 ENCOUNTER — Ambulatory Visit (HOSPITAL_BASED_OUTPATIENT_CLINIC_OR_DEPARTMENT_OTHER): Payer: Medicare Other | Admitting: Anesthesiology

## 2021-03-17 ENCOUNTER — Ambulatory Visit (HOSPITAL_COMMUNITY): Payer: Medicare Other | Admitting: Physician Assistant

## 2021-03-17 ENCOUNTER — Observation Stay (HOSPITAL_COMMUNITY)
Admission: RE | Admit: 2021-03-17 | Discharge: 2021-03-18 | Disposition: A | Discharge status: Home / Self Care | Payer: Medicare Other | Attending: Urology | Admitting: Urology

## 2021-03-17 DIAGNOSIS — C642 Malignant neoplasm of left kidney, except renal pelvis: Secondary | ICD-10-CM | POA: Diagnosis not present

## 2021-03-17 DIAGNOSIS — Z01812 Encounter for preprocedural laboratory examination: Secondary | ICD-10-CM

## 2021-03-17 DIAGNOSIS — D49512 Neoplasm of unspecified behavior of left kidney: Secondary | ICD-10-CM | POA: Diagnosis present

## 2021-03-17 DIAGNOSIS — I4891 Unspecified atrial fibrillation: Secondary | ICD-10-CM | POA: Diagnosis not present

## 2021-03-17 DIAGNOSIS — Z7982 Long term (current) use of aspirin: Secondary | ICD-10-CM | POA: Insufficient documentation

## 2021-03-17 DIAGNOSIS — I739 Peripheral vascular disease, unspecified: Secondary | ICD-10-CM | POA: Diagnosis not present

## 2021-03-17 DIAGNOSIS — Z87891 Personal history of nicotine dependence: Secondary | ICD-10-CM | POA: Diagnosis not present

## 2021-03-17 DIAGNOSIS — J449 Chronic obstructive pulmonary disease, unspecified: Secondary | ICD-10-CM | POA: Insufficient documentation

## 2021-03-17 DIAGNOSIS — D3002 Benign neoplasm of left kidney: Secondary | ICD-10-CM | POA: Diagnosis present

## 2021-03-17 DIAGNOSIS — M1990 Unspecified osteoarthritis, unspecified site: Secondary | ICD-10-CM

## 2021-03-17 HISTORY — PX: ROBOTIC ASSITED PARTIAL NEPHRECTOMY: SHX6087

## 2021-03-17 LAB — TYPE AND SCREEN
ABO/RH(D): B POS
Antibody Screen: NEGATIVE

## 2021-03-17 LAB — HEMOGLOBIN AND HEMATOCRIT, BLOOD
HCT: 43.4 % (ref 39.0–52.0)
Hemoglobin: 13.8 g/dL (ref 13.0–17.0)

## 2021-03-17 LAB — BASIC METABOLIC PANEL
Anion gap: 6 (ref 5–15)
BUN: 8 mg/dL (ref 8–23)
CO2: 25 mmol/L (ref 22–32)
Calcium: 8.1 mg/dL — ABNORMAL LOW (ref 8.9–10.3)
Chloride: 103 mmol/L (ref 98–111)
Creatinine, Ser: 1.05 mg/dL (ref 0.61–1.24)
GFR, Estimated: >60 mL/min (ref >60)
Glucose, Bld: 102 mg/dL — ABNORMAL HIGH (ref 70–99)
Potassium: 3.9 mmol/L (ref 3.5–5.1)
Sodium: 134 mmol/L — ABNORMAL LOW (ref 135–145)

## 2021-03-17 LAB — ABO/RH: ABO/RH(D): B POS

## 2021-03-17 SURGERY — NEPHRECTOMY, PARTIAL, ROBOT-ASSISTED
Anesthesia: General | Site: Abdomen | Laterality: Left

## 2021-03-17 MED ORDER — PROPOFOL 10 MG/ML IV BOLUS
INTRAVENOUS | Status: AC
  Filled 2021-03-17: qty 20

## 2021-03-17 MED ORDER — CEFAZOLIN SODIUM-DEXTROSE 2-4 GM/100ML-% IV SOLN
INTRAVENOUS | Status: AC
  Filled 2021-03-17: qty 100

## 2021-03-17 MED ORDER — IPRATROPIUM-ALBUTEROL 0.5-2.5 (3) MG/3ML IN SOLN
3.0000 mL | Freq: Two times a day (BID) | RESPIRATORY_TRACT | Status: DC
  Administered 2021-03-17 – 2021-03-18 (×2): 3 mL via RESPIRATORY_TRACT
  Filled 2021-03-17 (×2): qty 3

## 2021-03-17 MED ORDER — CEFAZOLIN SODIUM-DEXTROSE 2-4 GM/100ML-% IV SOLN
2.0000 g | Freq: Once | INTRAVENOUS | Status: AC
  Administered 2021-03-17: 2 g via INTRAVENOUS

## 2021-03-17 MED ORDER — ROCURONIUM BROMIDE 10 MG/ML (PF) SYRINGE
PREFILLED_SYRINGE | INTRAVENOUS | Status: DC | PRN
  Administered 2021-03-17 (×2): 20 mg via INTRAVENOUS
  Administered 2021-03-17: 60 mg via INTRAVENOUS

## 2021-03-17 MED ORDER — ACETAMINOPHEN 10 MG/ML IV SOLN
1000.0000 mg | Freq: Once | INTRAVENOUS | Status: DC | PRN
  Administered 2021-03-17: 1000 mg via INTRAVENOUS

## 2021-03-17 MED ORDER — HYDROMORPHONE HCL 1 MG/ML IJ SOLN
INTRAMUSCULAR | Status: AC
  Administered 2021-03-17: 0.25 mg via INTRAVENOUS
  Filled 2021-03-17: qty 2

## 2021-03-17 MED ORDER — PRAVASTATIN SODIUM 40 MG PO TABS
40.0000 mg | ORAL_TABLET | Freq: Every day | ORAL | Status: DC
Start: 2021-03-17 — End: 2021-03-18
  Administered 2021-03-17: 40 mg via ORAL
  Filled 2021-03-17: qty 1

## 2021-03-17 MED ORDER — ALBUTEROL SULFATE (2.5 MG/3ML) 0.083% IN NEBU: 2.5000 mg | INHALATION_SOLUTION | Freq: Four times a day (QID) | RESPIRATORY_TRACT | Status: DC | PRN

## 2021-03-17 MED ORDER — DIPHENHYDRAMINE HCL 12.5 MG/5ML PO ELIX: 12.5000 mg | ORAL_SOLUTION | Freq: Four times a day (QID) | ORAL | Status: DC | PRN

## 2021-03-17 MED ORDER — LACTATED RINGERS IR SOLN
Status: DC | PRN
  Administered 2021-03-17: 1000 mL

## 2021-03-17 MED ORDER — EPHEDRINE 5 MG/ML INJ
INTRAVENOUS | Status: AC
  Filled 2021-03-17: qty 5

## 2021-03-17 MED ORDER — MIDAZOLAM HCL 2 MG/2ML IJ SOLN
INTRAMUSCULAR | Status: DC | PRN
  Administered 2021-03-17: 2 mg via INTRAVENOUS

## 2021-03-17 MED ORDER — FLUTICASONE PROPIONATE 50 MCG/ACT NA SUSP
2.0000 | Freq: Every day | NASAL | Status: DC
  Administered 2021-03-18: 2 via NASAL
  Filled 2021-03-17: qty 16

## 2021-03-17 MED ORDER — LIDOCAINE HCL (PF) 2 % IJ SOLN
INTRAMUSCULAR | Status: AC
  Filled 2021-03-17: qty 5

## 2021-03-17 MED ORDER — DIPHENHYDRAMINE HCL 50 MG/ML IJ SOLN: 12.5000 mg | Freq: Four times a day (QID) | INTRAMUSCULAR | Status: DC | PRN

## 2021-03-17 MED ORDER — ONDANSETRON HCL 4 MG/2ML IJ SOLN
INTRAMUSCULAR | Status: DC | PRN
  Administered 2021-03-17: 4 mg via INTRAVENOUS

## 2021-03-17 MED ORDER — ONDANSETRON HCL 4 MG/2ML IJ SOLN
INTRAMUSCULAR | Status: AC
  Filled 2021-03-17: qty 2

## 2021-03-17 MED ORDER — SUFENTANIL CITRATE 50 MCG/ML IV SOLN
INTRAVENOUS | Status: AC
  Filled 2021-03-17: qty 1

## 2021-03-17 MED ORDER — METOPROLOL TARTRATE 25 MG PO TABS
12.5000 mg | ORAL_TABLET | Freq: Two times a day (BID) | ORAL | Status: DC
  Administered 2021-03-17 – 2021-03-18 (×2): 12.5 mg via ORAL
  Filled 2021-03-17 (×2): qty 1

## 2021-03-17 MED ORDER — SUCRALFATE 1 G PO TABS
1.0000 g | ORAL_TABLET | Freq: Four times a day (QID) | ORAL | Status: DC
  Administered 2021-03-17 – 2021-03-18 (×3): 1 g via ORAL
  Filled 2021-03-17 (×5): qty 1

## 2021-03-17 MED ORDER — AMISULPRIDE (ANTIEMETIC) 5 MG/2ML IV SOLN: 10.0000 mg | Freq: Once | INTRAVENOUS | Status: DC | PRN

## 2021-03-17 MED ORDER — LACTATED RINGERS IV SOLN: INTRAVENOUS | Status: DC

## 2021-03-17 MED ORDER — GABAPENTIN 100 MG PO CAPS
100.0000 mg | ORAL_CAPSULE | Freq: Three times a day (TID) | ORAL | Status: DC
  Administered 2021-03-17 – 2021-03-18 (×3): 100 mg via ORAL
  Filled 2021-03-17 (×3): qty 1

## 2021-03-17 MED ORDER — CHLORHEXIDINE GLUCONATE CLOTH 2 % EX PADS
6.0000 | MEDICATED_PAD | Freq: Every day | CUTANEOUS | Status: DC
  Administered 2021-03-17: 6 via TOPICAL

## 2021-03-17 MED ORDER — IPRATROPIUM-ALBUTEROL 0.5-2.5 (3) MG/3ML IN SOLN: 3.0000 mL | Freq: Two times a day (BID) | RESPIRATORY_TRACT | Status: DC

## 2021-03-17 MED ORDER — DEXTROSE-NACL 5-0.45 % IV SOLN: INTRAVENOUS | Status: DC

## 2021-03-17 MED ORDER — ONDANSETRON HCL 4 MG/2ML IJ SOLN: 4.0000 mg | INTRAMUSCULAR | Status: DC | PRN

## 2021-03-17 MED ORDER — MOMETASONE FURO-FORMOTEROL FUM 200-5 MCG/ACT IN AERO
2.0000 | INHALATION_SPRAY | Freq: Two times a day (BID) | RESPIRATORY_TRACT | Status: DC
  Administered 2021-03-18: 2 via RESPIRATORY_TRACT
  Filled 2021-03-17: qty 8.8

## 2021-03-17 MED ORDER — ACETAMINOPHEN 160 MG/5ML PO SOLN: 325.0000 mg | Freq: Once | ORAL | Status: DC | PRN

## 2021-03-17 MED ORDER — SUFENTANIL CITRATE 50 MCG/ML IV SOLN
INTRAVENOUS | Status: DC | PRN
  Administered 2021-03-17 (×4): 10 ug via INTRAVENOUS

## 2021-03-17 MED ORDER — CHLORHEXIDINE GLUCONATE 0.12 % MT SOLN: 15.0000 mL | Freq: Once | OROMUCOSAL | Status: DC

## 2021-03-17 MED ORDER — TRAMADOL HCL 50 MG PO TABS
50.0000 mg | ORAL_TABLET | Freq: Four times a day (QID) | ORAL | 0 refills | Status: DC | PRN
Start: 2021-03-17 — End: 2021-05-09

## 2021-03-17 MED ORDER — DEXAMETHASONE SODIUM PHOSPHATE 10 MG/ML IJ SOLN
INTRAMUSCULAR | Status: DC | PRN
  Administered 2021-03-17: 10 mg via INTRAVENOUS

## 2021-03-17 MED ORDER — LACTATED RINGERS IV SOLN: INTRAVENOUS | Status: DC | PRN

## 2021-03-17 MED ORDER — POLYVINYL ALCOHOL 1.4 % OP SOLN
1.0000 [drp] | OPHTHALMIC | Status: DC | PRN
  Filled 2021-03-17: qty 15

## 2021-03-17 MED ORDER — PANTOPRAZOLE SODIUM 40 MG PO TBEC
80.0000 mg | DELAYED_RELEASE_TABLET | Freq: Every day | ORAL | Status: DC
  Administered 2021-03-18: 80 mg via ORAL
  Filled 2021-03-17: qty 2

## 2021-03-17 MED ORDER — STERILE WATER FOR IRRIGATION IR SOLN
Status: DC | PRN
  Administered 2021-03-17: 1000 mL

## 2021-03-17 MED ORDER — EPHEDRINE SULFATE-NACL 50-0.9 MG/10ML-% IV SOSY
PREFILLED_SYRINGE | INTRAVENOUS | Status: DC | PRN
  Administered 2021-03-17 (×3): 5 mg via INTRAVENOUS

## 2021-03-17 MED ORDER — PROPOFOL 10 MG/ML IV BOLUS
INTRAVENOUS | Status: DC | PRN
  Administered 2021-03-17: 140 mg via INTRAVENOUS

## 2021-03-17 MED ORDER — MIDAZOLAM HCL 2 MG/2ML IJ SOLN
INTRAMUSCULAR | Status: AC
  Filled 2021-03-17: qty 2

## 2021-03-17 MED ORDER — SODIUM CHLORIDE (PF) 0.9 % IJ SOLN
INTRAMUSCULAR | Status: AC
  Filled 2021-03-17: qty 20

## 2021-03-17 MED ORDER — DOCUSATE SODIUM 100 MG PO CAPS: 100.0000 mg | ORAL_CAPSULE | Freq: Two times a day (BID) | ORAL | Status: AC

## 2021-03-17 MED ORDER — ROCURONIUM BROMIDE 10 MG/ML (PF) SYRINGE
PREFILLED_SYRINGE | INTRAVENOUS | Status: AC
  Filled 2021-03-17: qty 10

## 2021-03-17 MED ORDER — MEPERIDINE HCL 50 MG/ML IJ SOLN: 6.2500 mg | INTRAMUSCULAR | Status: DC | PRN

## 2021-03-17 MED ORDER — DEXAMETHASONE SODIUM PHOSPHATE 10 MG/ML IJ SOLN
INTRAMUSCULAR | Status: AC
  Filled 2021-03-17: qty 1

## 2021-03-17 MED ORDER — CEFAZOLIN SODIUM-DEXTROSE 1-4 GM/50ML-% IV SOLN
1.0000 g | Freq: Three times a day (TID) | INTRAVENOUS | Status: AC
  Administered 2021-03-17 – 2021-03-18 (×2): 1 g via INTRAVENOUS
  Filled 2021-03-17 (×2): qty 50

## 2021-03-17 MED ORDER — HYDROMORPHONE HCL 1 MG/ML IJ SOLN
0.2500 mg | INTRAMUSCULAR | Status: DC | PRN
  Administered 2021-03-17: 0.25 mg via INTRAVENOUS

## 2021-03-17 MED ORDER — SODIUM CHLORIDE (PF) 0.9 % IJ SOLN
INTRAMUSCULAR | Status: AC
  Filled 2021-03-17: qty 10

## 2021-03-17 MED ORDER — SODIUM CHLORIDE (PF) 0.9 % IJ SOLN
INTRAMUSCULAR | Status: DC | PRN
  Administered 2021-03-17: 20 mL

## 2021-03-17 MED ORDER — SUGAMMADEX SODIUM 200 MG/2ML IV SOLN
INTRAVENOUS | Status: DC | PRN
  Administered 2021-03-17: 200 mg via INTRAVENOUS

## 2021-03-17 MED ORDER — ACETAMINOPHEN 10 MG/ML IV SOLN
INTRAVENOUS | Status: AC
  Filled 2021-03-17: qty 100

## 2021-03-17 MED ORDER — BUPIVACAINE LIPOSOME 1.3 % IJ SUSP
INTRAMUSCULAR | Status: AC
  Filled 2021-03-17: qty 20

## 2021-03-17 MED ORDER — PROMETHAZINE HCL 25 MG/ML IJ SOLN: 6.2500 mg | INTRAMUSCULAR | Status: DC | PRN

## 2021-03-17 MED ORDER — DOCUSATE SODIUM 100 MG PO CAPS
100.0000 mg | ORAL_CAPSULE | Freq: Two times a day (BID) | ORAL | Status: DC
  Administered 2021-03-17 – 2021-03-18 (×2): 100 mg via ORAL
  Filled 2021-03-17 (×2): qty 1

## 2021-03-17 MED ORDER — LIDOCAINE 2% (20 MG/ML) 5 ML SYRINGE
INTRAMUSCULAR | Status: DC | PRN
Start: 2021-03-17 — End: 2021-03-17
  Administered 2021-03-17: 80 mg via INTRAVENOUS

## 2021-03-17 MED ORDER — BUPIVACAINE LIPOSOME 1.3 % IJ SUSP
INTRAMUSCULAR | Status: DC | PRN
  Administered 2021-03-17: 20 mL

## 2021-03-17 MED ORDER — MORPHINE SULFATE (PF) 2 MG/ML IV SOLN
2.0000 mg | INTRAVENOUS | Status: DC | PRN
  Administered 2021-03-17 – 2021-03-18 (×2): 2 mg via INTRAVENOUS
  Filled 2021-03-17 (×2): qty 1

## 2021-03-17 MED ORDER — ACETAMINOPHEN 325 MG PO TABS: 325.0000 mg | ORAL_TABLET | Freq: Once | ORAL | Status: DC | PRN

## 2021-03-17 MED ORDER — ORAL CARE MOUTH RINSE: 15.0000 mL | Freq: Once | OROMUCOSAL | Status: DC

## 2021-03-17 SURGICAL SUPPLY — 57 items
APPLICATOR SURGIFLO ENDO (HEMOSTASIS) IMPLANT
BAG COUNTER SPONGE SURGICOUNT (BAG) IMPLANT
CHLORAPREP W/TINT 26 (MISCELLANEOUS) ×2 IMPLANT
CLIP LIGATING HEM O LOK PURPLE (MISCELLANEOUS) ×2 IMPLANT
CLIP LIGATING HEMO O LOK GREEN (MISCELLANEOUS) ×4 IMPLANT
COVER SURGICAL LIGHT HANDLE (MISCELLANEOUS) ×2 IMPLANT
COVER TIP SHEARS 8 DVNC (MISCELLANEOUS) ×1 IMPLANT
COVER TIP SHEARS 8MM DA VINCI (MISCELLANEOUS) ×1
DERMABOND ADVANCED (GAUZE/BANDAGES/DRESSINGS) ×2
DERMABOND ADVANCED .7 DNX12 (GAUZE/BANDAGES/DRESSINGS) ×2 IMPLANT
DRAIN CHANNEL 15F RND FF 3/16 (WOUND CARE) ×2 IMPLANT
DRAPE ARM DVNC X/XI (DISPOSABLE) ×4 IMPLANT
DRAPE COLUMN DVNC XI (DISPOSABLE) ×1 IMPLANT
DRAPE DA VINCI XI ARM (DISPOSABLE) ×4
DRAPE DA VINCI XI COLUMN (DISPOSABLE) ×1
DRAPE INCISE 23X17 IOBAN STRL (DRAPES) ×1
DRAPE INCISE 23X17 STRL (DRAPES) IMPLANT
DRAPE INCISE IOBAN 23X17 STRL (DRAPES) ×1 IMPLANT
DRAPE INCISE IOBAN 66X45 STRL (DRAPES) ×2 IMPLANT
DRAPE SHEET LG 3/4 BI-LAMINATE (DRAPES) ×2 IMPLANT
ELECT PENCIL ROCKER SW 15FT (MISCELLANEOUS) ×2 IMPLANT
ELECT REM PT RETURN 15FT ADLT (MISCELLANEOUS) ×2 IMPLANT
EVACUATOR SILICONE 100CC (DRAIN) ×2 IMPLANT
GAUZE 4X4 16PLY ~~LOC~~+RFID DBL (SPONGE) ×2 IMPLANT
GLOVE SURG ENC MOIS LTX SZ6.5 (GLOVE) ×2 IMPLANT
GLOVE SURG ENC TEXT LTX SZ7.5 (GLOVE) ×4 IMPLANT
GOWN STRL REUS W/TWL LRG LVL3 (GOWN DISPOSABLE) ×6 IMPLANT
HEMOSTAT SURGICEL 4X8 (HEMOSTASIS) IMPLANT
HOLDER FOLEY CATH W/STRAP (MISCELLANEOUS) ×2 IMPLANT
IRRIG SUCT STRYKERFLOW 2 WTIP (MISCELLANEOUS) ×2
IRRIGATION SUCT STRKRFLW 2 WTP (MISCELLANEOUS) ×1 IMPLANT
KIT BASIN OR (CUSTOM PROCEDURE TRAY) ×2 IMPLANT
KIT TURNOVER KIT A (KITS) IMPLANT
POUCH SPECIMEN RETRIEVAL 10MM (ENDOMECHANICALS) ×2 IMPLANT
PROTECTOR NERVE ULNAR (MISCELLANEOUS) ×4 IMPLANT
SEAL CANN UNIV 5-8 DVNC XI (MISCELLANEOUS) ×4 IMPLANT
SEAL XI 5MM-8MM UNIVERSAL (MISCELLANEOUS) ×4
SET TUBE SMOKE EVAC HIGH FLOW (TUBING) ×2 IMPLANT
SOLUTION ELECTROLUBE (MISCELLANEOUS) ×2 IMPLANT
SPIKE FLUID TRANSFER (MISCELLANEOUS) ×2 IMPLANT
SURGIFLO W/THROMBIN 8M KIT (HEMOSTASIS) ×2 IMPLANT
SUT ETHILON 3 0 PS 1 (SUTURE) ×2 IMPLANT
SUT MNCRL AB 4-0 PS2 18 (SUTURE) ×4 IMPLANT
SUT PDS PLUS 0 (SUTURE) ×2
SUT PDS PLUS AB 0 CT-2 (SUTURE) ×2 IMPLANT
SUT V-LOC BARB 180 2/0GR6 GS22 (SUTURE) ×2
SUT VIC AB 0 CT1 27 (SUTURE) ×1
SUT VIC AB 0 CT1 27XBRD ANTBC (SUTURE) ×1 IMPLANT
SUT VLOC BARB 180 ABS3/0GR12 (SUTURE) ×4
SUTURE V-LC BRB 180 2/0GR6GS22 (SUTURE) ×1 IMPLANT
SUTURE VLOC BRB 180 ABS3/0GR12 (SUTURE) ×1 IMPLANT
TOWEL OR 17X26 10 PK STRL BLUE (TOWEL DISPOSABLE) ×2 IMPLANT
TOWEL OR NON WOVEN STRL DISP B (DISPOSABLE) ×2 IMPLANT
TRAY FOLEY MTR SLVR 16FR STAT (SET/KITS/TRAYS/PACK) ×2 IMPLANT
TRAY LAPAROSCOPIC (CUSTOM PROCEDURE TRAY) ×2 IMPLANT
TROCAR XCEL 12X100 BLDLESS (ENDOMECHANICALS) ×2 IMPLANT
WATER STERILE IRR 1000ML POUR (IV SOLUTION) ×2 IMPLANT

## 2021-03-17 NOTE — Anesthesia Postprocedure Evaluation (Signed)
Anesthesia Post Note  Patient: Laverne Klugh  Procedure(s) Performed: XI ROBOTIC ASSITED PARTIAL NEPHRECTOMY (Left: Abdomen)     Patient location during evaluation: PACU Anesthesia Type: General Level of consciousness: awake and alert Pain management: pain level controlled Vital Signs Assessment: post-procedure vital signs reviewed and stable Respiratory status: spontaneous breathing, nonlabored ventilation, respiratory function stable and patient connected to nasal cannula oxygen Cardiovascular status: blood pressure returned to baseline and stable Postop Assessment: no apparent nausea or vomiting Anesthetic complications: no   No notable events documented.  Last Vitals:  Vitals:   03/17/21 1300 03/17/21 1400  BP: (!) 140/93 131/72  Pulse: 78 77  Resp: 19 15  Temp:    SpO2: 99% 100%    Last Pain:  Vitals:   03/17/21 1400  PainSc: 0-No pain                 Effie Berkshire

## 2021-03-17 NOTE — Plan of Care (Signed)
Problem: Education: °Goal: Knowledge of General Education information will improve °Description: Including pain rating scale, medication(s)/side effects and non-pharmacologic comfort measures °Outcome: Completed/Met °  °

## 2021-03-17 NOTE — Progress Notes (Signed)
Patient ID: Martin Kaiser, male   DOB: [birth year], 66 y.o.   MRN: [id number]  Post-op note  Subjective: The patient is doing well.  No complaints.  Objective: Vital signs in last 24 hours: Temp:  [97.7 F (36.5 C)-97.8 F (36.6 C)] 97.8 F (36.6 C) (02/09 1500) Pulse Rate:  [69-80] 77 (02/09 1500) Resp:  [13-19] 19 (02/09 1500) BP: (128-155)/(61-93) 133/61 (02/09 1500) SpO2:  [90 %-100 %] 99 % (02/09 1500)  Intake/Output from previous day: No intake/output data recorded. Intake/Output this shift: Total I/O In: 1600 [I.V.:1500; IV Piggyback:100] Out: 350 [Urine:300; Blood:50]  Physical Exam:  General: Alert and oriented. Abdomen: Soft, Nondistended. Incisions: Clean and dry.  Lab Results: Recent Labs    03/17/21 1035  HGB 13.8  HCT 43.4    Assessment/Plan: POD#0   1) Continue to monitor, bedrest tonight   Martin Kaiser. MD   LOS: 0 days   Martin Kaiser 03/17/2021, 3:26 PM

## 2021-03-17 NOTE — Op Note (Addendum)
Preoperative diagnosis: Left renal neoplasm  Postoperative diagnosis: Left renal neoplasm  Procedure:  Left robotic-assisted laparoscopic partial nephrectomy  Surgeon: Pryor Curia. M.D.  Assistant(s): Debbrah Alar, PA-C  An assistant was required for this surgical procedure.  The duties of the assistant included but were not limited to suctioning, passing suture, camera manipulation, retraction. This procedure would not be able to be performed without an Environmental consultant.  Anesthesia: General  Complications: None  EBL: 50 mL  IVF:  1350 mL crystalloid  Specimens: Left renal neoplasm  Disposition of specimens: Pathology  Intraoperative findings:       1. Warm renal ischemia time: 14 minutes  Drains: # 15 Blake perinephric drain  Indication:  Martin Kaiser is a 66 y.o. year old patient with a left renal neoplasm.  After a thorough review of the management options for their renal mass, they elected to proceed with surgical treatment and the above procedure.  We have discussed the potential benefits and risks of the procedure, side effects of the proposed treatment, the likelihood of the patient achieving the goals of the procedure, and any potential problems that might occur during the procedure or recuperation. Informed consent has been obtained.   Description of procedure:  The patient was taken to the operating room and a general anesthetic was administered. The patient was given preoperative antibiotics, placed in the left modified flank position with care to pad all potential pressure points, and prepped and draped in the usual sterile fashion. Next a preoperative timeout was performed.  A site was selected in the upper midline for initial port placement. This was placed using a standard open Hassan technique which allowed entry into the peritoneal cavity under direct vision and without difficulty. A 12 mm port was placed and a pneumoperitoneum established. The  camera was then used to inspect the abdomen and there was no evidence of any intra-abdominal injuries or other abnormalities. The remaining abdominal ports were then placed. 8 mm robotic ports were placed in the left upper quadrant, left lower quadrant, and far left lateral abdominal wall. A 8 mm port was placed to the left of the midline just off the rectus muscle for the camera.. All ports were placed under direct vision without difficulty. The surgical cart was then docked.   Utilizing the cautery scissors, the white line of Toldt was incised allowing the colon to be mobilized medially and the plane between the mesocolon and the anterior layer of Gerotas fascia to be developed and the kidney to be exposed.  The ureter and gonadal vein were identified inferiorly and the ureter was lifted anteriorly off the psoas muscle.  Dissection proceeded superiorly along the gonadal vein until the renal vein was identified.  The renal hilum was then carefully isolated with a combination of blunt and sharp dissection allowing the renal arterial and venous structures to be separated and isolated in preparation for renal hilar vessel clamping. There was a single renal artery and vein.   Attention turned to the kidney and the perinephric fat surrounding the renal mass was removed and the kidney was mobilized sufficiently for exposure and resection of the renal mass.   Once the renal mass was properly isolated, preparations were made for resection of the tumor.  Reconstructive sutures were placed into the abdomen for the renorrhaphy portion of the procedure.  The renal artery was then clamped with bulldog clamps.  The tumor was then excised with cold scissor dissection along with an adequate visible gross margin of  normal renal parenchyma. The tumor appeared to be excised without any gross violation of the tumor. The renal collecting system was entered during removal of the tumor.  A running 3-0 V-lock suture was then  brought through the capsule of the kidney and run along the base of the renal defect to provide hemostasis and close any entry into the renal collecting system if present. Weck clips were used to secure this suture outside the renal capsule at the proximal and distal ends. An additional hemostatic agent (Surgiflo) was then placed into the renal defect. A running 2-0 V lock suture was then used to close the renal capsule using a sliding clip technique which resulted in excellent compression of the renal defect.    The bulldog clamps were then removed from the renal hilar vessel(s). Total warm renal ischemia time was 14 minutes. The renal tumor resection site was examined. Hemostasis appeared adequate.   The kidney was placed back into its normal anatomic position and covered with perinephric fat as needed.  A # 30 Blake drain was then brought through the lateral lower port site and positioned in the perinephric space.  It was secured to the skin with a nylon suture. The surgical cart was undocked.  The renal tumor specimen was removed intact within an endopouch retrieval bag via the upper midline port site.  The tumor was intact on placement into the retrieval bag but was noted to be disrupted on the back table.  No tumor spillage was noted intraoperatively.  All other laparoscopic/robotic ports had been removed under direct vision and the pneumoperitoneum let down with inspection of the operative field performed and hemostasis again confirmed. The upper midline incision was then closed at the fascial level with 0-vicryl suture. All incision sites were then injected with local anesthetic and reapproximated at the skin level with 4-0 monocryl subcuticular closures.  Liquiband was applied to the skin.  The patient tolerated the procedure well and without complications.  The patient was able to be extubated and transferred to the recovery unit in satisfactory condition.  Pryor Curia MD

## 2021-03-17 NOTE — Anesthesia Procedure Notes (Signed)
Procedure Name: Intubation Date/Time: 03/17/2021 7:31 AM Performed by: Sharlette Dense, CRNA Pre-anesthesia Checklist: Patient identified, Emergency Drugs available, Suction available and Patient being monitored Patient Re-evaluated:Patient Re-evaluated prior to induction Oxygen Delivery Method: Circle system utilized Preoxygenation: Pre-oxygenation with 100% oxygen Induction Type: IV induction Ventilation: Mask ventilation without difficulty and Oral airway inserted - appropriate to patient size Laryngoscope Size: Miller and 3 Grade View: Grade I Tube type: Oral Tube size: 8.0 mm Number of attempts: 1 Airway Equipment and Method: Stylet Placement Confirmation: ETT inserted through vocal cords under direct vision, positive ETCO2 and breath sounds checked- equal and bilateral Secured at: 22 cm Tube secured with: Tape Dental Injury: Teeth and Oropharynx as per pre-operative assessment

## 2021-03-17 NOTE — Transfer of Care (Signed)
Immediate Anesthesia Transfer of Care Note  Patient: Martin Kaiser  Procedure(s) Performed: XI ROBOTIC ASSITED PARTIAL NEPHRECTOMY (Left: Abdomen)  Patient Location: PACU  Anesthesia Type:General  Level of Consciousness: awake, alert  and oriented  Airway & Oxygen Therapy: Patient Spontanous Breathing and Patient connected to face mask oxygen  Post-op Assessment: Report given to RN and Post -op Vital signs reviewed and stable  Post vital signs: Reviewed and stable  Last Vitals:  Vitals Value Taken Time  BP 147/98 03/17/21 1011  Temp    Pulse 78 03/17/21 1012  Resp 15 03/17/21 1012  SpO2 100 % 03/17/21 1012  Vitals shown include unvalidated device data.  Last Pain: There were no vitals filed for this visit.       Complications: No notable events documented.

## 2021-03-17 NOTE — Progress Notes (Signed)
Epic down during pre-op phase. Downtime forms used and placed on physical chart.

## 2021-03-17 NOTE — Interval H&P Note (Signed)
History and Physical Interval Note:  03/17/2021 6:53 AM  Martin Kaiser  has presented today for surgery, with the diagnosis of LEFT ENAL NEOPLASM.  The various methods of treatment have been discussed with the patient and family. After consideration of risks, benefits and other options for treatment, the patient has consented to  Procedure(s): XI ROBOTIC ASSITED PARTIAL NEPHRECTOMY (Left) as a surgical intervention.  The patient's history has been reviewed, patient examined, no change in status, stable for surgery.  I have reviewed the patient's chart and labs.  Questions were answered to the patient's satisfaction.     [employer]

## 2021-03-17 NOTE — Discharge Instructions (Signed)

## 2021-03-18 ENCOUNTER — Encounter (HOSPITAL_COMMUNITY): Payer: Self-pay | Admitting: Urology

## 2021-03-18 DIAGNOSIS — C642 Malignant neoplasm of left kidney, except renal pelvis: Secondary | ICD-10-CM | POA: Diagnosis not present

## 2021-03-18 LAB — BASIC METABOLIC PANEL
Anion gap: 7 (ref 5–15)
BUN: 14 mg/dL (ref 8–23)
CO2: 25 mmol/L (ref 22–32)
Calcium: 8.6 mg/dL — ABNORMAL LOW (ref 8.9–10.3)
Chloride: 109 mmol/L (ref 98–111)
Creatinine, Ser: 1.71 mg/dL — ABNORMAL HIGH (ref 0.61–1.24)
GFR, Estimated: 44 mL/min — ABNORMAL LOW (ref >60)
Glucose, Bld: 116 mg/dL — ABNORMAL HIGH (ref 70–99)
Potassium: 3.9 mmol/L (ref 3.5–5.1)
Sodium: 141 mmol/L (ref 135–145)

## 2021-03-18 LAB — CREATININE, FLUID (PLEURAL, PERITONEAL, JP DRAINAGE): Creat, Fluid: 1.7 mg/dL

## 2021-03-18 LAB — HEMOGLOBIN AND HEMATOCRIT, BLOOD
HCT: 40.6 % (ref 39.0–52.0)
Hemoglobin: 12.9 g/dL — ABNORMAL LOW (ref 13.0–17.0)

## 2021-03-18 MED ORDER — TRAMADOL HCL 50 MG PO TABS
50.0000 mg | ORAL_TABLET | Freq: Four times a day (QID) | ORAL | Status: DC | PRN
  Administered 2021-03-18: 50 mg via ORAL
  Filled 2021-03-18: qty 1

## 2021-03-18 MED ORDER — BISACODYL 10 MG RE SUPP
10.0000 mg | Freq: Once | RECTAL | Status: DC
  Filled 2021-03-18: qty 1

## 2021-03-18 NOTE — Discharge Summary (Signed)
Date of admission: 03/17/2021  Date of discharge: 03/18/2021  Admission diagnosis: Left renal neoplasm  Discharge diagnosis: Left renal neoplasm  Secondary diagnoses: SVT, COPD, hyperlipidemia  History and Physical: For full details, please see admission history and physical. Briefly, Martin Kaiser is a 66 y.o. year old patient with an incidentally detected left renal mass.   Hospital Course: He underwent a left RAL partial nephrectomy on 03/17/21.  He tolerated this procedure well and remained hemodynamically stable.  On POD #1, he was able to ambulate, transition to oral pain medication, and his drain Cr was serum and was removed.  He was stable for discharge home on POD # 1.  Laboratory values:  Recent Labs    03/17/21 1035 03/18/21 0433  HGB 13.8 12.9*  HCT 43.4 40.6   Recent Labs    03/17/21 1035 03/18/21 0433  CREATININE 1.05 1.71*    Disposition: Home  Discharge instruction: The patient was instructed to be ambulatory but told to refrain from heavy lifting, strenuous activity, or driving.   Discharge medications:  Allergies as of 03/18/2021       Reactions   Iodine Hives        Medication List     STOP taking these medications    aspirin EC 81 MG tablet   azithromycin 250 MG tablet Commonly known as: ZITHROMAX   bacitracin ointment   meloxicam 15 MG tablet Commonly known as: MOBIC       TAKE these medications    albuterol 108 (90 Base) MCG/ACT inhaler Commonly known as: VENTOLIN HFA Inhale 1-2 puffs into the lungs in the morning and at bedtime.   docusate sodium 100 MG capsule Commonly known as: COLACE Take 1 capsule (100 mg total) by mouth 2 (two) times daily.   esomeprazole 40 MG capsule Commonly known as: NEXIUM Take 40 mg by mouth daily.   fluticasone 50 MCG/ACT nasal spray Commonly known as: FLONASE Place 2 sprays into both nostrils daily.   gabapentin 100 MG capsule Commonly known as: NEURONTIN Take 100 mg by mouth 3 (three)  times daily.   ipratropium-albuterol 0.5-2.5 (3) MG/3ML Soln Commonly known as: DUONEB Take 3 mLs by nebulization in the morning and at bedtime.   metoprolol tartrate 25 MG tablet Commonly known as: LOPRESSOR Take 12.5 mg by mouth 2 (two) times daily.   polyvinyl alcohol 1.4 % ophthalmic solution Commonly known as: LIQUIFILM TEARS Place 1 drop into both eyes as needed for dry eyes.   pravastatin 40 MG tablet Commonly known as: PRAVACHOL Take 40 mg by mouth at bedtime.   sucralfate 1 g tablet Commonly known as: CARAFATE Take 1 g by mouth 4 (four) times daily.   traMADol 50 MG tablet Commonly known as: Ultram Take 1-2 tablets (50-100 mg total) by mouth every 6 (six) hours as needed for moderate pain or severe pain.   triamcinolone cream 0.1 % Commonly known as: KENALOG Apply 1 application topically 2 (two) times daily.   Wixela Inhub 250-50 MCG/ACT Aepb Generic drug: fluticasone-salmeterol Inhale 1 puff into the lungs 2 (two) times daily.   ZANTAC 360 MAX ST PO Take 1 tablet by mouth daily as needed (acid reflux).        Followup:   Follow-up Information     Raynelle Bring, MD Follow up on 04/13/2021.   Specialty: Urology Why: at 12:45 Contact information: [place] [id number] [phone]

## 2021-03-18 NOTE — Progress Notes (Signed)
Patient ID: Martin Kaiser, male   DOB: May 04, 1955, 66 y.o.   MRN: [id number]  1 Day Post-Op Subjective: Doing well.  No nausea or vomiting.    Objective: Vital signs in last 24 hours: Temp:  [97.7 F (36.5 C)-98.6 F (37 C)] 98.6 F (37 C) (02/10 0407) Pulse Rate:  [69-98] 91 (02/10 0407) Resp:  [13-22] 18 (02/10 0407) BP: (128-158)/(61-93) 143/75 (02/10 0407) SpO2:  [90 %-100 %] 95 % (02/10 0407) Weight:  [97.3 kg] 97.3 kg (02/09 1608)  Intake/Output from previous day: 02/09 0701 - 02/10 0700 In: 5013.6 [P.O.:1860; I.V.:2953.6; IV Piggyback:200] Out: 8264 [Urine:4750; Drains:110; Blood:50] Intake/Output this shift: No intake/output data recorded.  Physical Exam:  General: Alert and oriented CV: RRR Lungs: Clear Abdomen: Soft, ND Incisions: C/D/I Ext: NT, No erythema  Lab Results: Recent Labs    03/17/21 1035 03/18/21 0433  HGB 13.8 12.9*  HCT 43.4 40.6   BMET Recent Labs    03/17/21 1035 03/18/21 0433  NA 134* 141  K 3.9 3.9  CL 103 109  CO2 25 25  GLUCOSE 102* 116*  BUN 8 14  CREATININE 1.05 1.71*  CALCIUM 8.1* 8.6*     Studies/Results: No results found.  Assessment/Plan: POD # 1 s/p left RAL partial nephrectomy - Ambulate, IS - Advance diet - D/C Foley - SL IVF - Oral pain meds - Check JP Cr - Assess for D/C home later today    LOS: 0 days   [name] 03/18/2021, 7:06 AM

## 2021-03-18 NOTE — Progress Notes (Signed)
Patient adamantly refused the Dulcolax suppository. Dr. Alinda Money made aware.

## 2021-03-21 LAB — SURGICAL PATHOLOGY

## 2021-04-26 ENCOUNTER — Emergency Department (HOSPITAL_BASED_OUTPATIENT_CLINIC_OR_DEPARTMENT_OTHER)
Admission: EM | Admit: 2021-04-26 | Discharge: 2021-04-26 | Disposition: A | Discharge status: Home / Self Care | Payer: Medicare Other | Attending: Emergency Medicine | Admitting: Emergency Medicine

## 2021-04-26 ENCOUNTER — Encounter (HOSPITAL_BASED_OUTPATIENT_CLINIC_OR_DEPARTMENT_OTHER): Payer: Self-pay | Admitting: *Deleted

## 2021-04-26 ENCOUNTER — Other Ambulatory Visit: Payer: Self-pay

## 2021-04-26 DIAGNOSIS — H5712 Ocular pain, left eye: Secondary | ICD-10-CM | POA: Insufficient documentation

## 2021-04-26 DIAGNOSIS — H5789 Other specified disorders of eye and adnexa: Secondary | ICD-10-CM | POA: Diagnosis not present

## 2021-04-26 DIAGNOSIS — H53142 Visual discomfort, left eye: Secondary | ICD-10-CM | POA: Diagnosis not present

## 2021-04-26 DIAGNOSIS — Z7951 Long term (current) use of inhaled steroids: Secondary | ICD-10-CM | POA: Diagnosis not present

## 2021-04-26 DIAGNOSIS — J45909 Unspecified asthma, uncomplicated: Secondary | ICD-10-CM | POA: Diagnosis not present

## 2021-04-26 DIAGNOSIS — H538 Other visual disturbances: Secondary | ICD-10-CM | POA: Insufficient documentation

## 2021-04-26 DIAGNOSIS — H109 Unspecified conjunctivitis: Secondary | ICD-10-CM

## 2021-04-26 MED ORDER — TETRACAINE HCL 0.5 % OP SOLN
1.0000 [drp] | Freq: Once | OPHTHALMIC | Status: AC
Start: 2021-04-26 — End: 2021-04-26
  Administered 2021-04-26: 1 [drp] via OPHTHALMIC
  Filled 2021-04-26: qty 4

## 2021-04-26 MED ORDER — KETOTIFEN FUMARATE 0.025 % OP SOLN
1.0000 [drp] | Freq: Two times a day (BID) | OPHTHALMIC | 0 refills | Status: AC
Start: 2021-04-26 — End: ?

## 2021-04-26 MED ORDER — CIPROFLOXACIN HCL 0.3 % OP SOLN: 1.0000 [drp] | OPHTHALMIC | 0 refills | Status: AC

## 2021-04-26 MED ORDER — FLUORESCEIN SODIUM 1 MG OP STRP
1.0000 | ORAL_STRIP | Freq: Once | OPHTHALMIC | Status: AC
  Administered 2021-04-26: 1 via OPHTHALMIC
  Filled 2021-04-26: qty 1

## 2021-04-26 NOTE — ED Triage Notes (Signed)
Left eye has been draining, red and painful x 4 days. ?

## 2021-04-26 NOTE — Discharge Instructions (Signed)
The medications I have prescribed as directed.  Follow-up with your eye doctor as directed. ?Get help right away if: ?You have redness, swelling, or other symptoms in only one eye. ?Your vision is blurred or you have other vision changes. ?You have severe eye pain. ?

## 2021-04-26 NOTE — ED Provider Notes (Signed)
?[facility] ?Provider Note ? ? ?CSN: [id number] ?Arrival date & time: 04/26/21  1752 ? ?  ? ?History ? ?No chief complaint on file. ? ? ?HPI ? ? ? ?Martin Kaiser is a 66 year-old-male with a history of asthma and seasonal allergies who presents to the ED for complaints of left eye pain. Patient states symptoms started about 5 days ago and pain is described as a dull achy pain rated 8/10. He endorses mild photophobia, redness, itchiness, clear drainage, and blurred vision of the left eye. Patient has used OTC eye lubricant drops which do not improve his symptoms. Patient has had similar symptoms in the past that usually self-resolve and are more mild. He denies any headache, fever, chills, visual floaters, purulent drainage, foreign body sensation, skin rash, N/V, or any trauma to the eye. Patient denies any recent sick contacts or travel ?  ? ?Home Medications ?Prior to Admission medications   ?Medication Sig Start Date End Date Taking? Authorizing Provider  ?albuterol (VENTOLIN HFA) 108 (90 Base) MCG/ACT inhaler Inhale 1-2 puffs into the lungs in the morning and at bedtime.    [provider]  ?docusate sodium (COLACE) 100 MG capsule Take 1 capsule (100 mg total) by mouth 2 (two) times daily. 03/17/21   Debbrah Alar, PA-C  ?esomeprazole (NEXIUM) 40 MG capsule Take 40 mg by mouth daily. 06/03/19   [provider]  ?Famotidine (ZANTAC 360 MAX ST PO) Take 1 tablet by mouth daily as needed (acid reflux).    [provider]  ?fluticasone (FLONASE) 50 MCG/ACT nasal spray Place 2 sprays into both nostrils daily.    [provider]  ?gabapentin (NEURONTIN) 100 MG capsule Take 100 mg by mouth 3 (three) times daily. 12/21/20   [provider]  ?ipratropium-albuterol (DUONEB) 0.5-2.5 (3) MG/3ML SOLN Take 3 mLs by nebulization in the morning and at bedtime. 02/22/18   [provider]  ?metoprolol tartrate (LOPRESSOR) 25 MG tablet Take 12.5 mg  by mouth 2 (two) times daily. 01/10/21   [provider]  ?polyvinyl alcohol (LIQUIFILM TEARS) 1.4 % ophthalmic solution Place 1 drop into both eyes as needed for dry eyes.    [provider]  ?pravastatin (PRAVACHOL) 40 MG tablet Take 40 mg by mouth at bedtime. 12/21/20   [provider]  ?sucralfate (CARAFATE) 1 g tablet Take 1 g by mouth 4 (four) times daily.    [provider]  ?traMADol (ULTRAM) 50 MG tablet Take 1-2 tablets (50-100 mg total) by mouth every 6 (six) hours as needed for moderate pain or severe pain. 03/17/21   Debbrah Alar, PA-C  ?triamcinolone (KENALOG) 0.1 % Apply 1 application topically 2 (two) times daily. 01/27/20   Ward, Delice Bison, DO  ?[place] INHUB 250-50 MCG/ACT AEPB Inhale 1 puff into the lungs 2 (two) times daily. 11/15/20   [provider]  ?   ? ?Allergies    ?Iodine   ? ?Review of Systems   ?Review of Systems ? ?Physical Exam ?Updated Vital Signs ?BP 129/83 (BP Location: Right Arm)   Pulse 78   Temp 98.4 ?F (36.9 ?C)   Resp 18   Ht 5' 6.5" (1.689 m)   Wt 97.3 kg   SpO2 96%   BMI 34.10 kg/m?  ?Physical Exam ?Vitals and nursing note reviewed.  ?Constitutional:   ?   General: He is not in acute distress. ?   Appearance: He is well-developed. He is not diaphoretic.  ?HENT:  ?  Head: Normocephalic and atraumatic.  ?Eyes:  ?   General: Vision grossly intact. No scleral icterus. ?   Intraocular pressure: Right eye pressure is 12 mmHg. Left eye pressure is 16 mmHg.  ?   Extraocular Movements: Extraocular movements intact.  ?   Conjunctiva/sclera:  ?   Right eye: Right conjunctiva is injected.  ?   Left eye: Left conjunctiva is injected (Left >Right). No chemosis or exudate. ?   Pupils: Pupils are equal, round, and reactive to light.  ?   Left eye: No corneal abrasion or fluorescein uptake. Seidel exam negative. ?Cardiovascular:  ?   Rate and Rhythm: Normal rate and regular rhythm.  ?   Heart sounds: Normal heart sounds.  ?Pulmonary:  ?    Effort: Pulmonary effort is normal. No respiratory distress.  ?   Breath sounds: Normal breath sounds.  ?Abdominal:  ?   Palpations: Abdomen is soft.  ?   Tenderness: There is no abdominal tenderness.  ?Musculoskeletal:  ?   Cervical back: Normal range of motion and neck supple.  ?Skin: ?   General: Skin is warm and dry.  ?Neurological:  ?   Mental Status: He is alert.  ?Psychiatric:     ?   Behavior: Behavior normal.  ? ? ?ED Results / Procedures / Treatments   ?Labs ?(all labs ordered are listed, but only abnormal results are displayed) ?Labs Reviewed - No data to display ? ?EKG ?None ? ?Radiology ?No results found. ? ?Procedures ?Procedures  ? ? ?Medications Ordered in ED ?Medications  ?fluorescein ophthalmic strip 1 strip (1 strip Left Eye Given by Other 04/26/21 1842)  ?tetracaine (PONTOCAINE) 0.5 % ophthalmic solution 1 drop (1 drop Both Eyes Given by Other 04/26/21 1843)  ? ? ?ED Course/ Medical Decision Making/ A&P ?  ?                        ?Medical Decision Making ?Patient with Scleral injection. No recent eye trauma or suspected microtrauma (dust, sand, etc). Negative Seidel sign. No significant photophobia or dendritic lesions. Patient does not wear contact lenses. Vision grossly intact. Given history and exam I have low suspicion for corneal abrasion or ulcer, globe rupture, uveitis, HSV keratitis, Endopthalmitis, Retinal Detachment, Angle Closure Glaucoma, Foreign Body. Patient appears to have conjunctivitis. Will treat with ciloxin and ketotifen. Pt has already scheduled f/u with Ophthalmology. ? ? ? ?Risk ?Prescription drug management. ? ? ? ?Final Clinical Impression(s) / ED Diagnoses ?Final diagnoses:  ?None  ? ? ?Rx / DC Orders ?ED Discharge Orders   ? ? None  ? ?  ? ? ?  ?Margarita Mail, PA-C ?04/26/21 1949 ? ?  ?Lennice Sites, DO ?04/26/21 2107 ? ?

## 2021-05-08 ENCOUNTER — Observation Stay (HOSPITAL_BASED_OUTPATIENT_CLINIC_OR_DEPARTMENT_OTHER)
Admission: EM | Admit: 2021-05-08 | Discharge: 2021-05-09 | Disposition: A | Discharge status: Home / Self Care | Payer: Medicare Other | Attending: Internal Medicine | Admitting: Internal Medicine

## 2021-05-08 ENCOUNTER — Emergency Department (HOSPITAL_BASED_OUTPATIENT_CLINIC_OR_DEPARTMENT_OTHER): Payer: Medicare Other

## 2021-05-08 ENCOUNTER — Encounter (HOSPITAL_BASED_OUTPATIENT_CLINIC_OR_DEPARTMENT_OTHER): Payer: Self-pay | Admitting: Urology

## 2021-05-08 DIAGNOSIS — J45909 Unspecified asthma, uncomplicated: Secondary | ICD-10-CM | POA: Diagnosis not present

## 2021-05-08 DIAGNOSIS — E669 Obesity, unspecified: Secondary | ICD-10-CM

## 2021-05-08 DIAGNOSIS — Z87891 Personal history of nicotine dependence: Secondary | ICD-10-CM | POA: Insufficient documentation

## 2021-05-08 DIAGNOSIS — J209 Acute bronchitis, unspecified: Secondary | ICD-10-CM | POA: Diagnosis present

## 2021-05-08 DIAGNOSIS — R0602 Shortness of breath: Secondary | ICD-10-CM | POA: Diagnosis present

## 2021-05-08 DIAGNOSIS — I480 Paroxysmal atrial fibrillation: Secondary | ICD-10-CM | POA: Diagnosis present

## 2021-05-08 DIAGNOSIS — I10 Essential (primary) hypertension: Secondary | ICD-10-CM | POA: Insufficient documentation

## 2021-05-08 DIAGNOSIS — Z20822 Contact with and (suspected) exposure to covid-19: Secondary | ICD-10-CM | POA: Diagnosis not present

## 2021-05-08 DIAGNOSIS — Z79899 Other long term (current) drug therapy: Secondary | ICD-10-CM | POA: Diagnosis not present

## 2021-05-08 DIAGNOSIS — Z85528 Personal history of other malignant neoplasm of kidney: Secondary | ICD-10-CM | POA: Diagnosis not present

## 2021-05-08 DIAGNOSIS — E66811 Obesity, class 1: Secondary | ICD-10-CM

## 2021-05-08 DIAGNOSIS — J441 Chronic obstructive pulmonary disease with (acute) exacerbation: Secondary | ICD-10-CM | POA: Diagnosis not present

## 2021-05-08 DIAGNOSIS — D49512 Neoplasm of unspecified behavior of left kidney: Secondary | ICD-10-CM | POA: Diagnosis present

## 2021-05-08 DIAGNOSIS — R0902 Hypoxemia: Secondary | ICD-10-CM

## 2021-05-08 LAB — BASIC METABOLIC PANEL
Anion gap: 11 (ref 5–15)
BUN: 16 mg/dL (ref 8–23)
CO2: 23 mmol/L (ref 22–32)
Calcium: 8.9 mg/dL (ref 8.9–10.3)
Chloride: 106 mmol/L (ref 98–111)
Creatinine, Ser: 1.34 mg/dL — ABNORMAL HIGH (ref 0.61–1.24)
GFR, Estimated: 60 mL/min — ABNORMAL LOW (ref >60)
Glucose, Bld: 98 mg/dL (ref 70–99)
Potassium: 3.4 mmol/L — ABNORMAL LOW (ref 3.5–5.1)
Sodium: 140 mmol/L (ref 135–145)

## 2021-05-08 LAB — CBC WITH DIFFERENTIAL/PLATELET
Abs Immature Granulocytes: 0.01 10*3/uL (ref 0.00–0.07)
Basophils Absolute: 0.1 10*3/uL (ref 0.0–0.1)
Basophils Relative: 1 %
Eosinophils Absolute: 0.8 10*3/uL — ABNORMAL HIGH (ref 0.0–0.5)
Eosinophils Relative: 10 %
HCT: 39.3 % (ref 39.0–52.0)
Hemoglobin: 13.1 g/dL (ref 13.0–17.0)
Immature Granulocytes: 0 %
Lymphocytes Relative: 25 %
Lymphs Abs: 2 10*3/uL (ref 0.7–4.0)
MCH: 28.5 pg (ref 26.0–34.0)
MCHC: 33.3 g/dL (ref 30.0–36.0)
MCV: 85.4 fL (ref 80.0–100.0)
Monocytes Absolute: 0.6 10*3/uL (ref 0.1–1.0)
Monocytes Relative: 8 %
Neutro Abs: 4.4 10*3/uL (ref 1.7–7.7)
Neutrophils Relative %: 56 %
Platelets: 296 10*3/uL (ref 150–400)
RBC: 4.6 MIL/uL (ref 4.22–5.81)
RDW: 14.3 % (ref 11.5–15.5)
WBC: 7.9 10*3/uL (ref 4.0–10.5)
nRBC: 0 % (ref 0.0–0.2)

## 2021-05-08 LAB — BRAIN NATRIURETIC PEPTIDE: B Natriuretic Peptide: 17.5 pg/mL (ref 0.0–100.0)

## 2021-05-08 LAB — TROPONIN I (HIGH SENSITIVITY): Troponin I (High Sensitivity): 4 ng/L (ref <18)

## 2021-05-08 MED ORDER — SODIUM CHLORIDE 0.9 % IV SOLN
500.0000 mg | Freq: Once | INTRAVENOUS | Status: AC
  Administered 2021-05-09: 500 mg via INTRAVENOUS
  Filled 2021-05-08: qty 5

## 2021-05-08 MED ORDER — ALBUTEROL SULFATE (2.5 MG/3ML) 0.083% IN NEBU
5.0000 mg | INHALATION_SOLUTION | Freq: Once | RESPIRATORY_TRACT | Status: AC
Start: 2021-05-08 — End: 2021-05-08
  Administered 2021-05-08: 5 mg via RESPIRATORY_TRACT
  Filled 2021-05-08: qty 6

## 2021-05-08 MED ORDER — METHYLPREDNISOLONE SODIUM SUCC 125 MG IJ SOLR
125.0000 mg | Freq: Once | INTRAMUSCULAR | Status: AC
  Administered 2021-05-08: 125 mg via INTRAVENOUS
  Filled 2021-05-08: qty 2

## 2021-05-08 MED ORDER — ALBUTEROL SULFATE HFA 108 (90 BASE) MCG/ACT IN AERS
2.0000 | INHALATION_SPRAY | RESPIRATORY_TRACT | Status: DC | PRN
  Filled 2021-05-08: qty 6.7

## 2021-05-08 MED ORDER — IPRATROPIUM-ALBUTEROL 0.5-2.5 (3) MG/3ML IN SOLN
3.0000 mL | Freq: Once | RESPIRATORY_TRACT | Status: AC
  Administered 2021-05-08: 3 mL via RESPIRATORY_TRACT
  Filled 2021-05-08: qty 3

## 2021-05-08 MED ORDER — SODIUM CHLORIDE 0.9 % IV SOLN
2.0000 g | Freq: Once | INTRAVENOUS | Status: AC
  Administered 2021-05-08: 2 g via INTRAVENOUS
  Filled 2021-05-08: qty 20

## 2021-05-08 MED ORDER — OXYMETAZOLINE HCL 0.05 % NA SOLN
1.0000 | Freq: Once | NASAL | Status: AC
  Administered 2021-05-08: 1 via NASAL
  Filled 2021-05-08: qty 30

## 2021-05-08 MED ORDER — ALBUTEROL SULFATE (2.5 MG/3ML) 0.083% IN NEBU
5.0000 mg | INHALATION_SOLUTION | Freq: Once | RESPIRATORY_TRACT | Status: AC
  Administered 2021-05-08: 5 mg via RESPIRATORY_TRACT
  Filled 2021-05-08: qty 6

## 2021-05-08 MED ORDER — MAGNESIUM SULFATE 2 GM/50ML IV SOLN
2.0000 g | Freq: Once | INTRAVENOUS | Status: AC
Start: 2021-05-08 — End: 2021-05-08
  Administered 2021-05-08: 2 g via INTRAVENOUS
  Filled 2021-05-08: qty 50

## 2021-05-08 NOTE — ED Triage Notes (Signed)
Pt states SOB since Friday ?H/O asthma, CHF, and Afib  ?States using albuterol with no relief  ? ?Resp at bedside, states bilateral rales noted  ?

## 2021-05-08 NOTE — ED Provider Notes (Signed)
?[facility] ?Provider Note ? ? ?CSN: [id number] ?Arrival date & time: 05/08/21  2131 ? ?  ? ?History ? ?Chief Complaint  ?Patient presents with  ? Shortness of Breath  ? ? ?Martin Kaiser is a 66 y.o. male. ? ?66 yo M with a chief complaints of shortness of breath.  Going on for about 3 days now.  Cough congestion and feels like prior history of COPD.  Has tried his inhaled medicines without significant improvement.  No fevers no chest pain or pressure. ? ? ?Shortness of Breath ? ?  ? ?Home Medications ?Prior to Admission medications   ?Medication Sig Start Date End Date Taking? Authorizing Provider  ?albuterol (VENTOLIN HFA) 108 (90 Base) MCG/ACT inhaler Inhale 1-2 puffs into the lungs in the morning and at bedtime.    [provider]  ?ciprofloxacin (CILOXAN) 0.3 % ophthalmic solution Place 1 drop into both eyes every 2 (two) hours. Administer 1 drop, every 2 hours, while awake, for 2 days. Then 1 drop, every 4 hours, while awake, for the next 5 days. 04/26/21   Margarita Mail, PA-C  ?docusate sodium (COLACE) 100 MG capsule Take 1 capsule (100 mg total) by mouth 2 (two) times daily. 03/17/21   Debbrah Alar, PA-C  ?esomeprazole (NEXIUM) 40 MG capsule Take 40 mg by mouth daily. 06/03/19   [provider]  ?Famotidine (ZANTAC 360 MAX ST PO) Take 1 tablet by mouth daily as needed (acid reflux).    [provider]  ?fluticasone (FLONASE) 50 MCG/ACT nasal spray Place 2 sprays into both nostrils daily.    [provider]  ?gabapentin (NEURONTIN) 100 MG capsule Take 100 mg by mouth 3 (three) times daily. 12/21/20   [provider]  ?ipratropium-albuterol (DUONEB) 0.5-2.5 (3) MG/3ML SOLN Take 3 mLs by nebulization in the morning and at bedtime. 02/22/18   [provider]  ?ketotifen (ZADITOR) 0.025 % ophthalmic solution Place 1 drop into both eyes 2 (two) times daily. 04/26/21   Margarita Mail, PA-C  ?metoprolol tartrate (LOPRESSOR) 25 MG  tablet Take 12.5 mg by mouth 2 (two) times daily. 01/10/21   [provider]  ?polyvinyl alcohol (LIQUIFILM TEARS) 1.4 % ophthalmic solution Place 1 drop into both eyes as needed for dry eyes.    [provider]  ?pravastatin (PRAVACHOL) 40 MG tablet Take 40 mg by mouth at bedtime. 12/21/20   [provider]  ?sucralfate (CARAFATE) 1 g tablet Take 1 g by mouth 4 (four) times daily.    [provider]  ?traMADol (ULTRAM) 50 MG tablet Take 1-2 tablets (50-100 mg total) by mouth every 6 (six) hours as needed for moderate pain or severe pain. 03/17/21   Debbrah Alar, PA-C  ?triamcinolone (KENALOG) 0.1 % Apply 1 application topically 2 (two) times daily. 01/27/20   Ward, Delice Bison, DO  ?[place] INHUB 250-50 MCG/ACT AEPB Inhale 1 puff into the lungs 2 (two) times daily. 11/15/20   [provider]  ?   ? ?Allergies    ?Iodine   ? ?Review of Systems   ?Review of Systems  ?Respiratory:  Positive for shortness of breath.   ? ?Physical Exam ?Updated Vital Signs ?BP 138/70   Pulse (!) 104   Temp 99.8 ?F (37.7 ?C) (Oral)   Resp (!) 25   Ht 5' 6.5" (1.689 m)   Wt 97.3 kg   SpO2 94%   BMI 34.10 kg/m?  ?Physical Exam ?Vitals and nursing note reviewed.  ?Constitutional:   ?  Appearance: He is well-developed.  ?HENT:  ?   Head: Normocephalic and atraumatic.  ?Eyes:  ?   Pupils: Pupils are equal, round, and reactive to light.  ?Neck:  ?   Vascular: No JVD.  ?Cardiovascular:  ?   Rate and Rhythm: Normal rate and regular rhythm.  ?   Heart sounds: No murmur heard. ?  No friction rub. No gallop.  ?Pulmonary:  ?   Effort: No respiratory distress.  ?   Breath sounds: Decreased breath sounds and wheezing present.  ?   Comments: Diminished breath sounds in all fields with faint expiratory wheezes. ?Abdominal:  ?   General: There is no distension.  ?   Tenderness: There is no abdominal tenderness. There is no guarding or rebound.  ?Musculoskeletal:     ?   General: Normal range of motion.  ?    Cervical back: Normal range of motion and neck supple.  ?Skin: ?   Coloration: Skin is not pale.  ?   Findings: No rash.  ?Neurological:  ?   Mental Status: He is alert and oriented to person, place, and time.  ?Psychiatric:     ?   Behavior: Behavior normal.  ? ? ?ED Results / Procedures / Treatments   ?Labs ?(all labs ordered are listed, but only abnormal results are displayed) ?Labs Reviewed  ?CBC WITH DIFFERENTIAL/PLATELET - Abnormal; Notable for the following components:  ?    Result Value  ? Eosinophils Absolute 0.8 (*)   ? All other components within normal limits  ?BASIC METABOLIC PANEL - Abnormal; Notable for the following components:  ? Potassium 3.4 (*)   ? Creatinine, Ser 1.34 (*)   ? GFR, Estimated 60 (*)   ? All other components within normal limits  ?RESP PANEL BY RT-PCR (FLU A&B, COVID) [id number]  ?CULTURE, BLOOD (ROUTINE X 2)  ?CULTURE, BLOOD (ROUTINE X 2)  ?BRAIN NATRIURETIC PEPTIDE  ?TROPONIN I (HIGH SENSITIVITY)  ? ? ?EKG ?EKG Interpretation ? ?Date/Time:  Sunday May 08 2021 21:50:47 EDT ?Ventricular Rate:  95 ?PR Interval:  149 ?QRS Duration: 77 ?QT Interval:  346 ?QTC Calculation: 435 ?R Axis:   4 ?Text Interpretation: Sinus rhythm No significant change since last tracing Confirmed by Deno Etienne [phone]) on 05/08/2021 9:52:55 PM ? ?Radiology ?DG Chest Port 1 View ? ?Result Date: 05/08/2021 ?CLINICAL DATA:  Shortness of breath EXAM: PORTABLE CHEST 1 VIEW COMPARISON:  02/17/2021 FINDINGS: Heart and mediastinal contours are within normal limits. No focal opacities or effusions. No acute bony abnormality. IMPRESSION: No active disease. Electronically Signed   By: Rolm Baptise M.D.   On: 05/08/2021 22:23   ? ?Procedures ?Procedures  ? ? ?Medications Ordered in ED ?Medications  ?albuterol (VENTOLIN HFA) 108 (90 Base) MCG/ACT inhaler 2 puff (has no administration in time range)  ?albuterol (PROVENTIL) (2.5 MG/3ML) 0.083% nebulizer solution 5 mg (has no administration in time range)  ?cefTRIAXone (ROCEPHIN)  2 g in sodium chloride 0.9 % 100 mL IVPB (has no administration in time range)  ?azithromycin (ZITHROMAX) 500 mg in sodium chloride 0.9 % 250 mL IVPB (has no administration in time range)  ?ipratropium-albuterol (DUONEB) 0.5-2.5 (3) MG/3ML nebulizer solution 3 mL (3 mLs Nebulization Given 05/08/21 2143)  ?albuterol (PROVENTIL) (2.5 MG/3ML) 0.083% nebulizer solution 5 mg (5 mg Nebulization Given 05/08/21 2142)  ?methylPREDNISolone sodium succinate (SOLU-MEDROL) 125 mg/2 mL injection 125 mg (125 mg Intravenous Given 05/08/21 2150)  ?magnesium sulfate IVPB 2 g 50 mL (0 g Intravenous Stopped 05/08/21 2250)  ?  oxymetazoline (AFRIN) 0.05 % nasal spray 1 spray (1 spray Each Nare Given 05/08/21 2236)  ? ? ?ED Course/ Medical Decision Making/ A&P ?  ?                        ?Medical Decision Making ?Amount and/or Complexity of Data Reviewed ?Labs: ordered. ?Radiology: ordered. ? ?Risk ?OTC drugs. ?Prescription drug management. ? ? ?66 yo M with a chief complaints of shortness of breath.  This been going on for about 3 days now.  Consistent with his prior COPD he thinks.  We will perform a chest x-ray blood work inhaled beta agonist.  Solu-Medrol magnesium and reassess. ? ?Patient's work of breathing is improved but still quite tachypneic.  Breathing about 30 times a minute.  He also is now requiring oxygen.  He was able to ambulate without hypoxia while ambulating with upon return to the bed dropped into the mid 80s.  Still quite tachypneic and reluctantly agreed for me to discuss his case with the hospitalist for possible admission. ? ?Chest x-ray independently interpreted by me with possible hazy left cardiac border.  Will start on Antibiotics. ? ?No significant anemia troponin negative ? ?The patients results and plan were reviewed and discussed.   ?Any x-rays performed were independently reviewed by myself.  ? ?Differential diagnosis were considered with the presenting HPI. ? ?Medications  ?albuterol (VENTOLIN HFA) 108 (90 Base)  MCG/ACT inhaler 2 puff (has no administration in time range)  ?albuterol (PROVENTIL) (2.5 MG/3ML) 0.083% nebulizer solution 5 mg (has no administration in time range)  ?cefTRIAXone (ROCEPHIN) 2 g in sodium chlo

## 2021-05-08 NOTE — ED Notes (Signed)
Respiratory to advise when breathing tx is finished so patient can be transported to xray. ?

## 2021-05-08 NOTE — ED Notes (Signed)
Placed patient on 2L [place] to increase oxygen saturation. Patient oxygen saturation increased to 94%. Patient tolerating well. ?

## 2021-05-09 ENCOUNTER — Other Ambulatory Visit: Payer: Self-pay

## 2021-05-09 DIAGNOSIS — Z20822 Contact with and (suspected) exposure to covid-19: Secondary | ICD-10-CM | POA: Diagnosis not present

## 2021-05-09 DIAGNOSIS — I480 Paroxysmal atrial fibrillation: Secondary | ICD-10-CM | POA: Diagnosis not present

## 2021-05-09 DIAGNOSIS — R0602 Shortness of breath: Secondary | ICD-10-CM | POA: Diagnosis present

## 2021-05-09 DIAGNOSIS — D49512 Neoplasm of unspecified behavior of left kidney: Secondary | ICD-10-CM | POA: Diagnosis not present

## 2021-05-09 DIAGNOSIS — Z79899 Other long term (current) drug therapy: Secondary | ICD-10-CM | POA: Diagnosis not present

## 2021-05-09 DIAGNOSIS — I10 Essential (primary) hypertension: Secondary | ICD-10-CM | POA: Diagnosis not present

## 2021-05-09 DIAGNOSIS — J45909 Unspecified asthma, uncomplicated: Secondary | ICD-10-CM | POA: Diagnosis not present

## 2021-05-09 DIAGNOSIS — Z85528 Personal history of other malignant neoplasm of kidney: Secondary | ICD-10-CM | POA: Diagnosis not present

## 2021-05-09 DIAGNOSIS — J441 Chronic obstructive pulmonary disease with (acute) exacerbation: Secondary | ICD-10-CM | POA: Diagnosis not present

## 2021-05-09 DIAGNOSIS — E669 Obesity, unspecified: Secondary | ICD-10-CM

## 2021-05-09 DIAGNOSIS — Z87891 Personal history of nicotine dependence: Secondary | ICD-10-CM | POA: Diagnosis not present

## 2021-05-09 DIAGNOSIS — R0902 Hypoxemia: Secondary | ICD-10-CM | POA: Diagnosis not present

## 2021-05-09 LAB — BASIC METABOLIC PANEL
Anion gap: 8 (ref 5–15)
BUN: 15 mg/dL (ref 8–23)
CO2: 24 mmol/L (ref 22–32)
Calcium: 9.1 mg/dL (ref 8.9–10.3)
Chloride: 108 mmol/L (ref 98–111)
Creatinine, Ser: 1.23 mg/dL (ref 0.61–1.24)
GFR, Estimated: >60 mL/min (ref >60)
Glucose, Bld: 148 mg/dL — ABNORMAL HIGH (ref 70–99)
Potassium: 4.1 mmol/L (ref 3.5–5.1)
Sodium: 140 mmol/L (ref 135–145)

## 2021-05-09 LAB — HIV ANTIBODY (ROUTINE TESTING W REFLEX): HIV Screen 4th Generation wRfx: NONREACTIVE

## 2021-05-09 LAB — RESP PANEL BY RT-PCR (FLU A&B, COVID) ARPGX2
Influenza A by PCR: NEGATIVE
Influenza B by PCR: NEGATIVE
SARS Coronavirus 2 by RT PCR: NEGATIVE

## 2021-05-09 LAB — CBC
HCT: 38.9 % — ABNORMAL LOW (ref 39.0–52.0)
Hemoglobin: 12.8 g/dL — ABNORMAL LOW (ref 13.0–17.0)
MCH: 28.3 pg (ref 26.0–34.0)
MCHC: 32.9 g/dL (ref 30.0–36.0)
MCV: 85.9 fL (ref 80.0–100.0)
Platelets: 283 10*3/uL (ref 150–400)
RBC: 4.53 MIL/uL (ref 4.22–5.81)
RDW: 14.2 % (ref 11.5–15.5)
WBC: 7.7 10*3/uL (ref 4.0–10.5)
nRBC: 0 % (ref 0.0–0.2)

## 2021-05-09 MED ORDER — METOPROLOL TARTRATE 12.5 MG HALF TABLET
12.5000 mg | ORAL_TABLET | ORAL | Status: AC
  Administered 2021-05-09: 12.5 mg via ORAL
  Filled 2021-05-09: qty 1

## 2021-05-09 MED ORDER — MOMETASONE FURO-FORMOTEROL FUM 200-5 MCG/ACT IN AERO
2.0000 | INHALATION_SPRAY | Freq: Two times a day (BID) | RESPIRATORY_TRACT | Status: DC
  Administered 2021-05-09: 2 via RESPIRATORY_TRACT
  Filled 2021-05-09: qty 8.8

## 2021-05-09 MED ORDER — CEPHALEXIN 500 MG PO CAPS
500.0000 mg | ORAL_CAPSULE | Freq: Three times a day (TID) | ORAL | 0 refills | Status: AC
Start: 2021-05-09 — End: 2021-05-13

## 2021-05-09 MED ORDER — ALBUTEROL SULFATE (2.5 MG/3ML) 0.083% IN NEBU
2.5000 mg | INHALATION_SOLUTION | RESPIRATORY_TRACT | Status: DC | PRN
Start: 2021-05-09 — End: 2021-05-09
  Administered 2021-05-09: 2.5 mg via RESPIRATORY_TRACT
  Filled 2021-05-09: qty 3

## 2021-05-09 MED ORDER — ALBUTEROL SULFATE (2.5 MG/3ML) 0.083% IN NEBU: 2.5000 mg | INHALATION_SOLUTION | RESPIRATORY_TRACT | Status: DC | PRN

## 2021-05-09 MED ORDER — METOPROLOL TARTRATE 12.5 MG HALF TABLET: 12.5000 mg | ORAL_TABLET | Freq: Two times a day (BID) | ORAL | Status: DC

## 2021-05-09 MED ORDER — PREDNISONE 10 MG PO TABS: 20.0000 mg | ORAL_TABLET | Freq: Every day | ORAL | 0 refills | Status: AC

## 2021-05-09 MED ORDER — PREDNISONE 20 MG PO TABS
40.0000 mg | ORAL_TABLET | Freq: Every day | ORAL | Status: DC
  Administered 2021-05-09: 40 mg via ORAL
  Filled 2021-05-09: qty 2

## 2021-05-09 MED ORDER — UMECLIDINIUM BROMIDE 62.5 MCG/ACT IN AEPB
1.0000 | INHALATION_SPRAY | Freq: Every day | RESPIRATORY_TRACT | Status: DC
  Administered 2021-05-09: 1 via RESPIRATORY_TRACT
  Filled 2021-05-09: qty 7

## 2021-05-09 MED ORDER — ENOXAPARIN SODIUM 40 MG/0.4ML IJ SOSY
40.0000 mg | PREFILLED_SYRINGE | INTRAMUSCULAR | Status: DC
  Administered 2021-05-09: 40 mg via SUBCUTANEOUS
  Filled 2021-05-09: qty 0.4

## 2021-05-09 MED ORDER — SODIUM CHLORIDE 0.9 % IV SOLN: 1.0000 g | INTRAVENOUS | Status: DC

## 2021-05-09 NOTE — Assessment & Plan Note (Signed)
COVID and flu neg ?Nl WBC, no PNA on CXR. ?Known h/o COPD with ED visits for exacerbations 10/2020, 01/2021, and 02/2021. ?1. COPD pathway ?2. Prednisone (got solumedrol in ED) ?3. PRN SABA ?4. Scheduled LABA/LAMA and INH steroid ?5. Rocephin ?6. Adult wheeze protocol ?7. Cont pulse ox and O2 via [place] PRN. ?

## 2021-05-09 NOTE — Progress Notes (Signed)
PT Cancellation/Screen Note ? ?Patient Details ?Name: Donis Pinder ?MRN: [id number] ?DOB: 1955/11/15 ? ? ?Cancelled Treatment:    Reason Eval/Treat Not Completed: PT screened, no needs identified, will sign off. Per chart reviewed-pt set to d/c. Spoke with RN who did not feel PT eval was needed. Will sign off. ? ? ? ?Doreatha Massed, PT ?Acute Rehabilitation  ?Office: [phone] ?Pager: [phone] ? ?  ?

## 2021-05-09 NOTE — Plan of Care (Signed)
?  Problem: Health Behavior/Discharge Planning: ?Goal: Ability to manage health-related needs will improve ?Outcome: Adequate for Discharge ?  ?Problem: Clinical Measurements: ?Goal: Ability to maintain clinical measurements within normal limits will improve ?Outcome: Adequate for Discharge ?Goal: Will remain free from infection ?Outcome: Adequate for Discharge ?Goal: Diagnostic test results will improve ?Outcome: Adequate for Discharge ?Goal: Respiratory complications will improve ?Outcome: Adequate for Discharge ?Goal: Cardiovascular complication will be avoided ?Outcome: Adequate for Discharge ?  ?Problem: Activity: ?Goal: Risk for activity intolerance will decrease ?Outcome: Adequate for Discharge ?  ?Problem: Elimination: ?Goal: Will not experience complications related to bowel motility ?Outcome: Adequate for Discharge ?Goal: Will not experience complications related to urinary retention ?Outcome: Adequate for Discharge ?  ?

## 2021-05-09 NOTE — Assessment & Plan Note (Addendum)
Doesn't look like pt on chronic AC. ?Followed by cards at [facility] (in [place] system). ?1. Cont home rate control meds once med rec is completed. ?2. Tele monitor ?

## 2021-05-09 NOTE — Discharge Summary (Signed)
Physician Discharge Summary  ?Macaulay Reicher FYB:[id number] DOB: 10-07-1955 DOA: 05/08/2021 ? ?PCP: Vincenza Hews, NP ? ?Admit date: 05/08/2021 ?Discharge date: 05/09/2021 ?Recommendations for Outpatient Follow-up:  ?Follow up with PCP in 1 weeks-call for appointment ?Please obtain BMP/CBC in one week ? ?Discharge Dispo: home ?Discharge Condition: Stable ?Code Status:   Code Status: Full Code ?Diet recommendation:  ?Diet Order   ? ?       ?  Diet Heart Room service appropriate? Yes; Fluid consistency: Thin  Diet effective now       ?  ? ?  ?  ? ?  ?  ? ?Brief/Interim Summary: ?66 year old male with no history of PAFib, hypertensio,asthma, RCC status post partial nephrectomy presents to the ER because of worsening shortness of breath, complain of wheezing and he felt like previous episodes of bronchitis, onset 3 days PTA along with cough and congestion, no chest pain no fever ?He was seen in the ED found to have diffusely wheezing.  Labs showed normal troponin BNP and WBC count, COVID-19 negative chest x-ray no acute finding EKG sinus rhythm.  Patient was admitted for COPD with acute exacerbation placed on systemic steroid bronchodilators IV antibiotics.  ?At this time patient has clinically improved he is ambulating without oxygen, no shortness of breath mild wheezing but stable.  He is requesting to go home today.  We will send him home on oral dose of steroid and antibiotics course and he will see his PCP next week patient and wife instructed.  He has nebulizer and inhaler at home which will continue to use  ? ?Discharge Diagnoses:  ?Principal Problem: ?  COPD with acute exacerbation ([place]) ?Active Problems: ?  Neoplasm of left kidney ?  PAF (paroxysmal atrial fibrillation) ([place]) ?  Obesity, Class I, BMI 30-34.9 ?Acute COPD exacerbation: No pneumonia on chest x-ray, clinically improved will discharge home on prednisone taper along with antibiotics and his home inhalers and bronchodilators. ? ?Paf: In sinus rhythm,  rate controlled.  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  Renal function stable creatinine 1.2. ?Class I obesity with BMI 34.1, benefit with PCP follow-up weight loss. ?  ?Consults: ?none ?Subjective: ?Alert awake oriented resting comfortably on room air.  Ambulating.  Wife at the bedside.  Requesting for discharge home today. ? ?Discharge Exam: ?Vitals:  ? 05/09/21 0858 05/09/21 0921  ?BP:  (!) 144/76  ?Pulse:  77  ?Resp:  20  ?Temp:  99.3 ?F (37.4 ?C)  ?SpO2: 97% 95%  ? ?General: Pt is alert, awake, not in acute distress ?Cardiovascular: RRR, S1/S2 +, no rubs, no gallops ?Respiratory: CTA bilaterally, no wheezing, no rhonchi ?Abdominal: Soft, NT, ND, bowel sounds + ?Extremities: no edema, no cyanosis ? ?Discharge Instructions ? ?Discharge Instructions   ? ? Discharge instructions   Complete by: As directed ?  ? Please call call MD or return to ER for similar or worsening recurring problem that brought you to hospital or if any fever,nausea/vomiting,abdominal pain, uncontrolled pain, chest pain,  shortness of breath or any other alarming symptoms. ? ?Please follow-up your doctor as instructed in a week time and call the office for appointment. ? ?Please avoid alcohol, smoking, or any other illicit substance and maintain healthy habits including taking your regular medications as prescribed. ? ?You were cared for by a hospitalist during your hospital stay. If you have any questions about your discharge  medications or the care you received while you were in the hospital after you are discharged, you can call the unit and ask to speak with the hospitalist on call if the hospitalist that took care of you is not available. ? ?Once you are discharged, your primary care physician will handle any further medical issues. Please note that NO REFILLS for any discharge medications will be authorized  once you are discharged, as it is imperative that you return to your primary care physician (or establish a relationship with a primary care physician if you do not have one) for your aftercare needs so that they can reassess your need for medications and monitor your lab values  ? Increase activity slowly   Complete by: As directed ?  ? ?  ? ?Allergies as of 05/09/2021   ? ?   Reactions  ? Tramadol Other (See Comments)  ? hallucinations  ? ?  ? ?  ?Medication List  ?  ? ?STOP taking these medications   ? ?traMADol 50 MG tablet ?Commonly known as: Ultram ?  ?triamcinolone cream 0.1 % ?Commonly known as: KENALOG ?  ? ?  ? ?TAKE these medications   ? ?acetaminophen 500 MG tablet ?Commonly known as: TYLENOL ?Take 1,000 mg by mouth every 6 (six) hours as needed (pain). ?  ?albuterol 108 (90 Base) MCG/ACT inhaler ?Commonly known as: VENTOLIN HFA ?Inhale 2 puffs into the lungs 2 (two) times daily as needed for wheezing or shortness of breath. ?  ?aspirin EC 81 MG tablet ?Take 81 mg by mouth every morning. Swallow whole. ?  ?cephALEXin 500 MG capsule ?Commonly known as: KEFLEX ?Take 1 capsule (500 mg total) by mouth 3 (three) times daily for 4 days. ?  ?cetirizine 10 MG tablet ?Commonly known as: ZYRTEC ?Take 10 mg by mouth every morning. ?  ?ciprofloxacin 0.3 % ophthalmic solution ?Commonly known as: Ciloxan ?Place 1 drop into both eyes every 2 (two) hours. Administer 1 drop, every 2 hours, while awake, for 2 days. Then 1 drop, every 4 hours, while awake, for the next 5 days. ?What changed:  ?when to take this ?additional instructions ?  ?diphenhydramine-acetaminophen 25-500 MG Tabs tablet ?Commonly known as: TYLENOL PM ?Take 1 tablet by mouth at bedtime as needed (pain/sleep). ?  ?docusate sodium 100 MG capsule ?Commonly known as: COLACE ?Take 1 capsule (100 mg total) by mouth 2 (two) times daily. ?What changed:  ?when to take this ?reasons to take this ?  ?esomeprazole 20 MG capsule ?Commonly known as: [place] ?Take 20 mg  by mouth daily as needed (acid reflux). ?  ?fluticasone 50 MCG/ACT nasal spray ?Commonly known as: FLONASE ?Place 1 spray into both nostrils 3 (three) times daily as needed for allergies or rhinitis. ?  ?fluticasone-salmeterol 250-50 MCG/ACT Aepb ?Commonly known as: ADVAIR ?Inhale 1 puff into the lungs in the morning and at bedtime. ?  ?gabapentin 100 MG capsule ?Commonly known as: NEURONTIN ?Take 100 mg by mouth 2 (two) times daily. ?  ?ipratropium-albuterol 0.5-2.5 (3) MG/3ML Soln ?Commonly known as: DUONEB ?Take 3 mLs by nebulization See admin instructions. Inhale 3 mls into the lungs twice daily - morning and noon ?  ?ketotifen 0.025 % ophthalmic solution ?Commonly known as: ZADITOR ?Place 1 drop into both eyes 2 (two) times daily. ?  ?metoprolol tartrate 25 MG tablet ?Commonly known as: LOPRESSOR ?Take 12.5 mg by mouth 2 (two) times daily. ?  ?multivitamin with minerals Tabs tablet ?Take 1 tablet by mouth every morning. ?  ?  OXYGEN ?Inhale 2 L into the lungs See admin instructions. Whenever napping or sleeping ?  ?polyvinyl alcohol 1.4 % ophthalmic solution ?Commonly known as: LIQUIFILM TEARS ?Place 1 drop into both eyes as needed for dry eyes. ?  ?pravastatin 40 MG tablet ?Commonly known as: PRAVACHOL ?Take 40 mg by mouth at bedtime. ?  ?predniSONE 10 MG tablet ?Commonly known as: DELTASONE ?Take 2 tablets (20 mg total) by mouth daily for 5 days. ?  ?sucralfate 1 g tablet ?Commonly known as: CARAFATE ?Take 1 g by mouth 2 (two) times daily. ?  ? ?  ? ? Follow-up Information   ? ? Vincenza Hews, NP Follow up in 1 week(s).   ?Specialty: Family Medicine ?Contact information: ?[address].  ?[address] ?[place] [address] ?[phone] ? ? ?  ?  ? ?  ?  ? ?  ? ?Allergies  ?Allergen Reactions  ? Tramadol Other (See Comments)  ?  hallucinations  ? ? ?The results of significant diagnostics from this hospitalization (including imaging, microbiology, ancillary and laboratory) are listed below for reference.    ? ?Microbiology: ?Recent Results (from the past 240 hour(s))  ?Resp Panel by RT-PCR (Flu A&B, Covid) Nasopharyngeal Swab     Status: None  ? Collection Time: 05/08/21 11:18 PM  ? Specimen: Nasopharyngeal S

## 2021-05-09 NOTE — Assessment & Plan Note (Signed)
S/p partial nephrectomy of L kidney in Feb ?

## 2021-05-09 NOTE — Hospital Course (Addendum)
66 year old male with no history of PAFib, hypertensio,asthma, RCC status post partial nephrectomy presents to the ER because of worsening shortness of breath, complain of wheezing and he felt like previous episodes of bronchitis, onset 3 days PTA along with cough and congestion, no chest pain no fever ?He was seen in the ED found to have diffusely wheezing.  Labs showed normal troponin BNP and WBC count, COVID-19 negative chest x-ray no acute finding EKG sinus rhythm.  Patient was admitted for COPD with acute exacerbation placed on systemic steroid bronchodilators IV antibiotics.  ?At this time patient has clinically improved he is ambulating without oxygen, no shortness of breath mild wheezing but stable.  He is requesting to go home today.  We will send him home on oral dose of steroid and antibiotics course and he will see his PCP next week patient and wife instructed.  He has nebulizer and inhaler at home which will continue to use ?

## 2021-05-09 NOTE — Evaluation (Signed)
Occupational Therapy Evaluation ?Patient Details ?Name: Martin Kaiser ?MRN: [id number] ?DOB: [birth year] ?Today's Date: 05/09/2021 ? ? ?History of Present Illness Patient is a 66 year old male who presented with SOB ans wheezing. patient was found to ahve COPD with acute exacerbation. PMH: COPD, PAF, gout  ? ?Clinical Impression ?  ?Patient evaluated by Occupational Therapy with no further acute OT needs identified. All education has been completed and the patient has no further questions. Patient is MI on this date for ADLs. Wife present in room and in agreement that patient is at baseline at this time.  See below for any follow-up Occupational Therapy or equipment needs. OT is signing off. Thank you for this referral. ?  ?   ? ?Recommendations for follow up therapy are one component of a multi-disciplinary discharge planning process, led by the attending physician.  Recommendations may be updated based on patient status, additional functional criteria and insurance authorization.  ? ?Follow Up Recommendations ? No OT follow up  ?  ?Assistance Recommended at Discharge None  ?Patient can return home with the following   ? ?  ?Functional Status Assessment ? Patient has not had a recent decline in their functional status  ?Equipment Recommendations ? None recommended by OT  ?  ?Recommendations for Other Services   ? ? ?  ?Precautions / Restrictions Precautions ?Precaution Comments: monitor O2 ?Restrictions ?Weight Bearing Restrictions: No  ? ?  ? ?Mobility Bed Mobility ?Overal bed mobility: Modified Independent ?  ?  ?  ?  ?  ?  ?  ?  ? ?Transfers ?  ?  ?  ?  ?  ?  ?  ?  ?  ?  ?  ? ?  ?Balance Overall balance assessment: No apparent balance deficits (not formally assessed) ?  ?  ?  ?  ?  ?  ?  ?  ?  ?  ?  ?  ?  ?  ?  ?  ?  ?  ?   ? ?ADL either performed or assessed with clinical judgement  ? ?ADL Overall ADL's : Modified independent ?  ?  ?  ?  ?  ?  ?  ?  ?  ?  ?  ?  ?  ?  ?  ?  ?  ?  ?  ?General ADL Comments:  patient is MI with bed mobiltiy, toileting, transfers functional mobility at this time. patient O2 was noted to stay above 94% on RA during session. patient was educated on ECT, O2 monitoring, and deep breathing strategies. patient verbalized understanding. wife present during session as well.  ? ? ? ?Vision Patient Visual Report: No change from baseline ?   ?   ?Perception   ?  ?Praxis   ?  ? ?Pertinent Vitals/Pain Pain Assessment ?Pain Assessment: No/denies pain  ? ? ? ?Hand Dominance Left ?  ?Extremity/Trunk Assessment Upper Extremity Assessment ?Upper Extremity Assessment: Overall WFL for tasks assessed ?  ?Lower Extremity Assessment ?Lower Extremity Assessment: Defer to PT evaluation ?  ?Cervical / Trunk Assessment ?Cervical / Trunk Assessment: Normal ?  ?Communication Communication ?Communication: No difficulties ?  ?Cognition Arousal/Alertness: Awake/alert ?Behavior During Therapy: [facility] for tasks assessed/performed ?Overall Cognitive Status: Within Functional Limits for tasks assessed ?  ?  ?  ?  ?  ?  ?  ?  ?  ?  ?  ?  ?  ?  ?  ?  ?General Comments: wife present as well ?  ?  ?  General Comments    ? ?  ?Exercises   ?  ?Shoulder Instructions    ? ? ?Home Living Family/patient expects to be discharged to:: Private residence ?Living Arrangements: Spouse/significant other ?Available Help at Discharge: Family;Available PRN/intermittently ?Type of Home: House ?Home Access: Stairs to enter ?Entrance Stairs-Number of Steps: 3 ?Entrance Stairs-Rails: Can reach both ?Home Layout: One level ?  ?  ?Bathroom Shower/Tub: Tub/shower unit;Walk-in shower ?  ?  ?  ?  ?Home Equipment: None ?  ?  ?  ? ?  ?Prior Functioning/Environment Prior Level of Function : Independent/Modified Independent ?  ?  ?  ?  ?  ?  ?  ?  ?  ? ?  ?  ?OT Problem List:   ?  ?   ?OT Treatment/Interventions:    ?  ?OT Goals(Current goals can be found in the care plan section) Acute Rehab OT Goals ?OT Goal Formulation: All assessment and education complete,  DC therapy  ?OT Frequency:   ?  ? ?Co-evaluation   ?  ?  ?  ?  ? ?  ?AM-PAC OT "6 Clicks" Daily Activity     ?Outcome Measure Help from another person eating meals?: None ?Help from another person taking care of personal grooming?: None ?Help from another person toileting, which includes using toliet, bedpan, or urinal?: None ?Help from another person bathing (including washing, rinsing, drying)?: None ?Help from another person to put on and taking off regular upper body clothing?: None ?Help from another person to put on and taking off regular lower body clothing?: None ?6 Click Score: 24 ?  ?End of Session Nurse Communication: Mobility status ? ?Activity Tolerance: Patient tolerated treatment well ?Patient left: in bed;with call bell/phone within reach;with family/visitor present ? ?OT Visit Diagnosis: Unsteadiness on feet (R26.81)  ?              ?Time: 4010-2725 ?OT Time Calculation (min): 26 min ?Charges:  OT General Charges ?$OT Visit: 1 Visit ?OT Evaluation ?$OT Eval Low Complexity: 1 Low ?OT Treatments ?$Therapeutic Activity: 8-22 mins ? ?Walden Statz OTR/L, MS ?Acute Rehabilitation Department ?Office# [phone] ?Pager# [phone] ? ? ?Feliz Beam Timm Bonenberger ?05/09/2021, 11:20 AM ?

## 2021-05-09 NOTE — Plan of Care (Signed)
Pt for discharge home this afternoon.  ?AVS printed and reviewed with pt and spouse at bedside.  ?All new medications and follow up instructions discussed.  ?Verbalized understanding, all questions addressed.  ?IV and telemetry removed.  ?Family to transport home.  ?Problem: Health Behavior/Discharge Planning: ?Goal: Ability to manage health-related needs will improve ?Outcome: Adequate for Discharge ?  ?Problem: Clinical Measurements: ?Goal: Ability to maintain clinical measurements within normal limits will improve ?Outcome: Adequate for Discharge ?Goal: Will remain free from infection ?Outcome: Adequate for Discharge ?Goal: Diagnostic test results will improve ?Outcome: Adequate for Discharge ?Goal: Respiratory complications will improve ?Outcome: Adequate for Discharge ?Goal: Cardiovascular complication will be avoided ?Outcome: Adequate for Discharge ?  ?Problem: Activity: ?Goal: Risk for activity intolerance will decrease ?Outcome: Adequate for Discharge ?  ?Problem: Elimination: ?Goal: Will not experience complications related to bowel motility ?Outcome: Adequate for Discharge ?Goal: Will not experience complications related to urinary retention ?Outcome: Adequate for Discharge ?  ? ?

## 2021-05-09 NOTE — H&P (Signed)
?History and Physical  ? ? ?Patient: Martin Kaiser GEX:[id number] DOB: [birth year] ?DOA: 05/08/2021 ?DOS: the patient was seen and examined on 05/09/2021 ?PCP: Vincenza Hews, NP  ?Patient coming from: Home ? ?Chief Complaint:  ?Chief Complaint  ?Patient presents with  ? Shortness of Breath  ? ?HPI: Martin Kaiser is a 66 y.o. male with medical history significant of COPD, PAF, RCC s/p partial nephrectomy. ? ?Pt in to [facility] with c/o wheezing and SOB.  Feels like prior episodes of bronchitis. ? ?Onset 3 days ago, worsening.  Cough and congestion. ? ?No fevers, no CP. ? ?Home inhalers providing little relief.  ?Review of Systems: As mentioned in the history of present illness. All other systems reviewed and are negative. ?Past Medical History:  ?Diagnosis Date  ? Arthritis   ? Asthma   ? Atrial fibrillation ([place])   ? Cancer [facility])   ? Dyspnea   ? ON OCCAS  ? Gout   ? Peripheral vascular disease ([place])   ? Pneumonia   ? ?Past Surgical History:  ?Procedure Laterality Date  ? ROBOTIC ASSITED PARTIAL NEPHRECTOMY Left 03/17/2021  ? Procedure: XI ROBOTIC ASSITED PARTIAL NEPHRECTOMY;  Surgeon: Raynelle Bring, MD;  Location: [facility] ORS;  Service: Urology;  Laterality: Left;  ? ?Social History:  reports that he has quit smoking. His smoking use included cigarettes. He quit smokeless tobacco use about 2 years ago.  His smokeless tobacco use included snuff. He reports that he does not currently use alcohol. He reports that he does not use drugs. ? ?Allergies  ?Allergen Reactions  ? Iodine Hives  ? ? ?Family History  ?Problem Relation Age of Onset  ? Diabetes Mother   ? Hypertension Mother   ? ? ?Prior to Admission medications   ?Medication Sig Start Date End Date Taking? Authorizing Provider  ?albuterol (VENTOLIN HFA) 108 (90 Base) MCG/ACT inhaler Inhale 1-2 puffs into the lungs in the morning and at bedtime.    [provider]  ?ciprofloxacin (CILOXAN) 0.3 % ophthalmic solution Place 1 drop into both eyes every 2 (two) hours.  Administer 1 drop, every 2 hours, while awake, for 2 days. Then 1 drop, every 4 hours, while awake, for the next 5 days. 04/26/21   Margarita Mail, PA-C  ?docusate sodium (COLACE) 100 MG capsule Take 1 capsule (100 mg total) by mouth 2 (two) times daily. 03/17/21   Debbrah Alar, PA-C  ?esomeprazole (NEXIUM) 40 MG capsule Take 40 mg by mouth daily. 06/03/19   [provider]  ?Famotidine (ZANTAC 360 MAX ST PO) Take 1 tablet by mouth daily as needed (acid reflux).    [provider]  ?fluticasone (FLONASE) 50 MCG/ACT nasal spray Place 2 sprays into both nostrils daily.    [provider]  ?gabapentin (NEURONTIN) 100 MG capsule Take 100 mg by mouth 3 (three) times daily. 12/21/20   [provider]  ?ipratropium-albuterol (DUONEB) 0.5-2.5 (3) MG/3ML SOLN Take 3 mLs by nebulization in the morning and at bedtime. 02/22/18   [provider]  ?ketotifen (ZADITOR) 0.025 % ophthalmic solution Place 1 drop into both eyes 2 (two) times daily. 04/26/21   Margarita Mail, PA-C  ?metoprolol tartrate (LOPRESSOR) 25 MG tablet Take 12.5 mg by mouth 2 (two) times daily. 01/10/21   [provider]  ?polyvinyl alcohol (LIQUIFILM TEARS) 1.4 % ophthalmic solution Place 1 drop into both eyes as needed for dry eyes.    [provider]  ?pravastatin (PRAVACHOL) 40 MG tablet Take 40 mg by mouth at  bedtime. 12/21/20   [provider]  ?sucralfate (CARAFATE) 1 g tablet Take 1 g by mouth 4 (four) times daily.    [provider]  ?traMADol (ULTRAM) 50 MG tablet Take 1-2 tablets (50-100 mg total) by mouth every 6 (six) hours as needed for moderate pain or severe pain. 03/17/21   Debbrah Alar, PA-C  ?triamcinolone (KENALOG) 0.1 % Apply 1 application topically 2 (two) times daily. 01/27/20   Ward, Delice Bison, DO  ?[place] INHUB 250-50 MCG/ACT AEPB Inhale 1 puff into the lungs 2 (two) times daily. 11/15/20   [provider]  ? ? ?Physical Exam: ?Vitals:  ? 05/08/21 2206  05/08/21 2326 05/09/21 0006 05/09/21 0126  ?BP:   (!) 151/86 (!) 152/100  ?Pulse:   98 88  ?Resp:   (!) 25 18  ?Temp:   98.8 ?F (37.1 ?C) 99.1 ?F (37.3 ?C)  ?TempSrc:   Oral Oral  ?SpO2: 94% 96% 97% 99%  ?Weight:      ?Height:      ? ?Constitutional: NAD, calm, comfortable ?Eyes: PERRL, lids and conjunctivae normal ?ENMT: Mucous membranes are moist. Posterior pharynx clear of any exudate or lesions.Normal dentition.  ?Neck: normal, supple, no masses, no thyromegaly ?Respiratory: Diffuse wheezing ?Cardiovascular: Regular rate and rhythm, no murmurs / rubs / gallops. No extremity edema. 2+ pedal pulses. No carotid bruits.  ?Abdomen: no tenderness, no masses palpated. No hepatosplenomegaly. Bowel sounds positive.  ?Musculoskeletal: no clubbing / cyanosis. No joint deformity upper and lower extremities. Good ROM, no contractures. Normal muscle tone.  ?Skin: no rashes, lesions, ulcers. No induration ?Neurologic: CN 2-12 grossly intact. Sensation intact, DTR normal. Strength 5/5 in all 4.  ?Psychiatric: Normal judgment and insight. Alert and oriented x 3. Normal mood.  ? ?Data Reviewed: ? ?Trop nl ?BNP nl ?WBC nl ?Creat 1.3, was 1.25 in Dec 2022 ?COVID and flu neg. ?CXR neg ?EKG shows sinus rhythm ? ?Assessment and Plan: ?* COPD with acute exacerbation ([place]) ?COVID and flu neg ?Nl WBC, no PNA on CXR. ?Known h/o COPD with ED visits for exacerbations 10/2020, 01/2021, and 02/2021. ?COPD pathway ?Prednisone (got solumedrol in ED) ?PRN SABA ?Scheduled LABA/LAMA and INH steroid ?Rocephin ?Adult wheeze protocol ?Cont pulse ox and O2 via [place] PRN. ? ?A-fib ([place]) ?Doesn't look like pt on chronic AC. ?Followed by cards at [facility] (in [place] [facility]). ?Cont home rate control meds once med rec is completed. ?Tele monitor ? ?Neoplasm of left kidney ?S/p partial nephrectomy of L kidney in Feb ? ? ? ? ? Advance Care Planning:   Code Status: Full Code ? ?Consults: None ? ?Family Communication: No family in room ? ?Severity of Illness: ?The  appropriate patient status for this patient is OBSERVATION. Observation status is judged to be reasonable and necessary in order to provide the required intensity of service to ensure the patient's safety. The patient's presenting symptoms, physical exam findings, and initial radiographic and laboratory data in the context of their medical condition is felt to place them at decreased risk for further clinical deterioration. Furthermore, it is anticipated that the patient will be medically stable for discharge from the hospital within 2 midnights of admission.  ? ?Author: ?Jennette Kettle M., DO ?05/09/2021 1:40 AM ? ?For on call review [email or web address].  ?

## 2021-05-09 NOTE — Progress Notes (Signed)
Patient arrived from [place]. A &OX4, ambulatory. ?

## 2021-05-09 NOTE — Progress Notes (Signed)
SATURATION QUALIFICATIONS: (This note is used to comply with regulatory documentation for home oxygen) ? ?Patient Saturations on Room Air at Rest = 96% ? ?Patient Saturations on Room Air while Ambulating = 93% ? ?Pt ambulated length of unit and back to his room with RN and PCT.  ?No shortness of breath reported.  ? ?Pt has no need for additional oxygen at home beyond his nocturnal pre-qualified needs.  ?

## 2021-05-14 LAB — CULTURE, BLOOD (ROUTINE X 2)
Culture: NO GROWTH
Culture: NO GROWTH
Special Requests: ADEQUATE
Special Requests: ADEQUATE

## 2021-06-30 ENCOUNTER — Encounter (HOSPITAL_BASED_OUTPATIENT_CLINIC_OR_DEPARTMENT_OTHER): Payer: Self-pay | Admitting: Emergency Medicine

## 2021-06-30 ENCOUNTER — Other Ambulatory Visit: Payer: Self-pay

## 2021-06-30 ENCOUNTER — Emergency Department (HOSPITAL_BASED_OUTPATIENT_CLINIC_OR_DEPARTMENT_OTHER): Payer: Medicare Other

## 2021-06-30 ENCOUNTER — Emergency Department (HOSPITAL_BASED_OUTPATIENT_CLINIC_OR_DEPARTMENT_OTHER)
Admission: EM | Admit: 2021-06-30 | Discharge: 2021-06-30 | Disposition: A | Discharge status: Home / Self Care | Payer: Medicare Other | Attending: Emergency Medicine | Admitting: Emergency Medicine

## 2021-06-30 DIAGNOSIS — R109 Unspecified abdominal pain: Secondary | ICD-10-CM | POA: Diagnosis present

## 2021-06-30 DIAGNOSIS — R1032 Left lower quadrant pain: Secondary | ICD-10-CM | POA: Diagnosis not present

## 2021-06-30 LAB — COMPREHENSIVE METABOLIC PANEL
ALT: 23 U/L (ref 0–44)
AST: 21 U/L (ref 15–41)
Albumin: 3.7 g/dL (ref 3.5–5.0)
Alkaline Phosphatase: 43 U/L (ref 38–126)
Anion gap: 6 (ref 5–15)
BUN: 17 mg/dL (ref 8–23)
CO2: 24 mmol/L (ref 22–32)
Calcium: 9.2 mg/dL (ref 8.9–10.3)
Chloride: 109 mmol/L (ref 98–111)
Creatinine, Ser: 1.34 mg/dL — ABNORMAL HIGH (ref 0.61–1.24)
GFR, Estimated: 60 mL/min — ABNORMAL LOW (ref >60)
Glucose, Bld: 123 mg/dL — ABNORMAL HIGH (ref 70–99)
Potassium: 3.9 mmol/L (ref 3.5–5.1)
Sodium: 139 mmol/L (ref 135–145)
Total Bilirubin: 0.4 mg/dL (ref 0.3–1.2)
Total Protein: 7.2 g/dL (ref 6.5–8.1)

## 2021-06-30 LAB — URINALYSIS, ROUTINE W REFLEX MICROSCOPIC
Bilirubin Urine: NEGATIVE
Glucose, UA: NEGATIVE mg/dL
Hgb urine dipstick: NEGATIVE
Ketones, ur: NEGATIVE mg/dL
Leukocytes,Ua: NEGATIVE
Nitrite: NEGATIVE
Protein, ur: NEGATIVE mg/dL
Specific Gravity, Urine: 1.025 (ref 1.005–1.030)
pH: 5.5 (ref 5.0–8.0)

## 2021-06-30 LAB — CBC WITH DIFFERENTIAL/PLATELET
Abs Immature Granulocytes: 0.01 10*3/uL (ref 0.00–0.07)
Basophils Absolute: 0 10*3/uL (ref 0.0–0.1)
Basophils Relative: 1 %
Eosinophils Absolute: 0.4 10*3/uL (ref 0.0–0.5)
Eosinophils Relative: 8 %
HCT: 40.2 % (ref 39.0–52.0)
Hemoglobin: 13.2 g/dL (ref 13.0–17.0)
Immature Granulocytes: 0 %
Lymphocytes Relative: 28 %
Lymphs Abs: 1.3 10*3/uL (ref 0.7–4.0)
MCH: 28.2 pg (ref 26.0–34.0)
MCHC: 32.8 g/dL (ref 30.0–36.0)
MCV: 85.9 fL (ref 80.0–100.0)
Monocytes Absolute: 0.4 10*3/uL (ref 0.1–1.0)
Monocytes Relative: 9 %
Neutro Abs: 2.5 10*3/uL (ref 1.7–7.7)
Neutrophils Relative %: 54 %
Platelets: 261 10*3/uL (ref 150–400)
RBC: 4.68 MIL/uL (ref 4.22–5.81)
RDW: 14.3 % (ref 11.5–15.5)
WBC: 4.7 10*3/uL (ref 4.0–10.5)
nRBC: 0 % (ref 0.0–0.2)

## 2021-06-30 MED ORDER — FENTANYL CITRATE PF 50 MCG/ML IJ SOSY
50.0000 ug | PREFILLED_SYRINGE | Freq: Once | INTRAMUSCULAR | Status: AC
  Administered 2021-06-30: 50 ug via INTRAVENOUS
  Filled 2021-06-30: qty 1

## 2021-06-30 MED ORDER — IOHEXOL 300 MG/ML  SOLN
100.0000 mL | Freq: Once | INTRAMUSCULAR | Status: AC | PRN
  Administered 2021-06-30: 100 mL via INTRAVENOUS

## 2021-06-30 NOTE — Discharge Instructions (Signed)
I would follow-up with your primary care doctor regarding the episode of pain in your side today.  If you are having worsening pain, any fever vomiting or other new concerning symptom please return to ER for reassessment.

## 2021-06-30 NOTE — ED Triage Notes (Signed)
Left flank tenderness x 1 day , Hx kidney Cancer. Denies urinary symptoms. Denies NV

## 2021-06-30 NOTE — ED Provider Notes (Signed)
[place] EMERGENCY DEPARTMENT Provider Note   CSN: [id number] Arrival date & time: 06/30/21  1147     History  Chief Complaint  Patient presents with   Flank Pain    left    Martin Kaiser is a 66 y.o. male.  Presenting to the emergency room due to concern for flank pain.  Patient reports that he has a history of kidney cancer that was removed.  I completed chart review, noted kidney mass removal by Dr. Alinda Money in February 2023, robotic assisted partial nephrectomy.  Patient states that surgery was successful and he is on no ongoing medication or treatment for that kidney mass.  Patient states that this morning he started having some left-sided pain, nonradiating, it is worse with some movements and improved with rest.  No association with difficulty in breathing.  No chest pain.  He denies any blood in urine or pain with urination or fever or chills.  HPI     Home Medications Prior to Admission medications   Medication Sig Start Date End Date Taking? Authorizing Provider  acetaminophen (TYLENOL) 500 MG tablet Take 1,000 mg by mouth every 6 (six) hours as needed (pain). Patient not taking: Reported on 05/09/2021    [provider]  albuterol (VENTOLIN HFA) 108 (90 Base) MCG/ACT inhaler Inhale 2 puffs into the lungs 2 (two) times daily as needed for wheezing or shortness of breath.    [provider]  aspirin EC 81 MG tablet Take 81 mg by mouth every morning. Swallow whole.    [provider]  cetirizine (ZYRTEC) 10 MG tablet Take 10 mg by mouth every morning. 03/24/21   [provider]  ciprofloxacin (CILOXAN) 0.3 % ophthalmic solution Place 1 drop into both eyes every 2 (two) hours. Administer 1 drop, every 2 hours, while awake, for 2 days. Then 1 drop, every 4 hours, while awake, for the next 5 days. Patient taking differently: Place 1 drop into both eyes See admin instructions. Ordered 04/26/21:  Administer 1 drop into both eyes every 2  hours, while awake, for 2 days. Then 1 drop into both eyes every 4 hours, while awake, for the next 5 days. 04/26/21   Harris, Abigail, PA-C  diphenhydramine-acetaminophen (TYLENOL PM) 25-500 MG TABS tablet Take 1 tablet by mouth at bedtime as needed (pain/sleep). Patient not taking: Reported on 05/09/2021    [provider]  docusate sodium (COLACE) 100 MG capsule Take 1 capsule (100 mg total) by mouth 2 (two) times daily. Patient taking differently: Take 100 mg by mouth 2 (two) times daily as needed (constipation). 03/17/21   Debbrah Alar, PA-C  esomeprazole (NEXIUM) 20 MG capsule Take 20 mg by mouth daily as needed (acid reflux).    [provider]  fluticasone (FLONASE) 50 MCG/ACT nasal spray Place 1 spray into both nostrils 3 (three) times daily as needed for allergies or rhinitis.    [provider]  fluticasone-salmeterol (ADVAIR) 250-50 MCG/ACT AEPB Inhale 1 puff into the lungs in the morning and at bedtime.    [provider]  gabapentin (NEURONTIN) 100 MG capsule Take 100 mg by mouth 2 (two) times daily. 12/21/20   [provider]  ipratropium-albuterol (DUONEB) 0.5-2.5 (3) MG/3ML SOLN Take 3 mLs by nebulization See admin instructions. Inhale 3 mls into the lungs twice daily - morning and noon 02/22/18   [provider]  ketotifen (ZADITOR) 0.025 % ophthalmic solution Place 1 drop into both eyes 2 (two) times daily. Patient not taking:  Reported on 05/09/2021 04/26/21   Margarita Mail, PA-C  metoprolol tartrate (LOPRESSOR) 25 MG tablet Take 12.5 mg by mouth 2 (two) times daily. 01/10/21   [provider]  Multiple Vitamin (MULTIVITAMIN WITH MINERALS) TABS tablet Take 1 tablet by mouth every morning.    [provider]  OXYGEN Inhale 2 L into the lungs See admin instructions. Whenever napping or sleeping    [provider]  polyvinyl alcohol (LIQUIFILM TEARS) 1.4 % ophthalmic solution Place 1 drop into both eyes as needed  for dry eyes.    [provider]  pravastatin (PRAVACHOL) 40 MG tablet Take 40 mg by mouth at bedtime. 12/21/20   [provider]  sucralfate (CARAFATE) 1 g tablet Take 1 g by mouth 2 (two) times daily.    [provider]      Allergies    Tramadol    Review of Systems   Review of Systems  Constitutional:  Negative for chills and fever.  HENT:  Negative for ear pain and sore throat.   Eyes:  Negative for pain and visual disturbance.  Respiratory:  Negative for cough and shortness of breath.   Cardiovascular:  Negative for chest pain and palpitations.  Gastrointestinal:  Negative for abdominal pain and vomiting.  Genitourinary:  Positive for flank pain. Negative for dysuria and hematuria.  Musculoskeletal:  Negative for arthralgias and back pain.  Skin:  Negative for color change and rash.  Neurological:  Negative for seizures and syncope.  All other systems reviewed and are negative.  Physical Exam Updated Vital Signs BP 136/82 (BP Location: Left Arm)   Pulse 61   Temp 98.1 F (36.7 C) (Oral)   Resp 18   Ht '5\' 6"'$  (1.676 m)   Wt 98 kg   SpO2 99%   BMI 34.86 kg/m  Physical Exam Vitals and nursing note reviewed.  Constitutional:      General: He is not in acute distress.    Appearance: He is well-developed.  HENT:     Head: Normocephalic and atraumatic.  Eyes:     Conjunctiva/sclera: Conjunctivae normal.  Cardiovascular:     Rate and Rhythm: Normal rate and regular rhythm.     Heart sounds: No murmur heard. Pulmonary:     Effort: Pulmonary effort is normal. No respiratory distress.     Breath sounds: Normal breath sounds.  Abdominal:     Palpations: Abdomen is soft.     Tenderness: There is no abdominal tenderness. There is no guarding or rebound.     Hernia: No hernia is present.  Musculoskeletal:        General: No swelling.     Cervical back: Neck supple.     Comments: Some tenderness to palpation to the left flank, there is no  swelling or erythema noted  Skin:    General: Skin is warm and dry.     Capillary Refill: Capillary refill takes less than 2 seconds.  Neurological:     Mental Status: He is alert.  Psychiatric:        Mood and Affect: Mood normal.    ED Results / Procedures / Treatments   Labs (all labs ordered are listed, but only abnormal results are displayed) Labs Reviewed  COMPREHENSIVE METABOLIC PANEL - Abnormal; Notable for the following components:      Result Value   Glucose, Bld 123 (*)    Creatinine, Ser 1.34 (*)    GFR, Estimated 60 (*)    All other components  within normal limits  URINALYSIS, ROUTINE W REFLEX MICROSCOPIC  CBC WITH DIFFERENTIAL/PLATELET    EKG None  Radiology CT ABDOMEN PELVIS W CONTRAST  Result Date: 06/30/2021 CLINICAL DATA:  Left flank pain. EXAM: CT ABDOMEN AND PELVIS WITH CONTRAST TECHNIQUE: Multidetector CT imaging of the abdomen and pelvis was performed using the standard protocol following bolus administration of intravenous contrast. RADIATION DOSE REDUCTION: This exam was performed according to the departmental dose-optimization program which includes automated exposure control, adjustment of the mA and/or kV according to patient size and/or use of iterative reconstruction technique. CONTRAST:  163m OMNIPAQUE IOHEXOL 300 MG/ML  SOLN COMPARISON:  MRI of December 28, 2020. FINDINGS: Lower chest: No acute abnormality. Hepatobiliary: No focal liver abnormality is seen. No gallstones, gallbladder wall thickening, or biliary dilatation. Pancreas: Unremarkable. No pancreatic ductal dilatation or surrounding inflammatory changes. Spleen: Normal in size without focal abnormality. Adrenals/Urinary Tract: Adrenal glands and appear normal. Right kidney is unremarkable. Findings are seen involving upper pole of left kidney consistent with partial nephrectomy. No hydronephrosis or renal obstruction is noted. Urinary bladder is unremarkable. Stomach/Bowel: Stomach is within  normal limits. Appendix appears normal. No evidence of bowel wall thickening, distention, or inflammatory changes. Vascular/Lymphatic: No significant vascular findings are present. No enlarged abdominal or pelvic lymph nodes. Reproductive: Prostate is unremarkable. Other: No abdominal wall hernia or abnormality. No abdominopelvic ascites. Musculoskeletal: No acute or significant osseous findings. IMPRESSION: Expected postoperative changes are seen involving the upper pole of left kidney consistent with history of partial nephrectomy. No other significant abnormality seen in the abdomen or pelvis. Electronically Signed   By: JMarijo ConceptionM.D.   On: 06/30/2021 13:16    Procedures Procedures    Medications Ordered in ED Medications  fentaNYL (SUBLIMAZE) injection 50 mcg (50 mcg Intravenous Given 06/30/21 1247)  iohexol (OMNIPAQUE) 300 MG/ML solution 100 mL (100 mLs Intravenous Contrast Given 06/30/21 1254)    ED Course/ Medical Decision Making/ A&P                           Medical Decision Making Amount and/or Complexity of Data Reviewed Labs: ordered. Radiology: ordered.  Risk Prescription drug management.   66year old gentleman presents to ER due to concern for left flank pain.  Had some reproducible tenderness to his left lower flank region on exam, vitals normal.  Most notable medical history patient had a partial nephrectomy, mass removal by Dr. BAlinda Moneyin February 2023 per my review of his chart and obtaining additional history.  UA today without evidence for infection.  No leukocytosis, no AKI, no electrolyte derangement.  Check CT, no acute pathology identified in abdomen or pelvis.  I independently reviewed and interpreted CT imaging and agree with radiology report.  On reassessment pt remains well appearing, no ongoing pain. Will dc home.   Wife at bedside updated throughout stay.     After the discussed management above, the patient was determined to be safe for discharge.   The patient was in agreement with this plan and all questions regarding their care were answered.  ED return precautions were discussed and the patient will return to the ED with any significant worsening of condition.         Final Clinical Impression(s) / ED Diagnoses Final diagnoses:  Flank pain    Rx / DC Orders ED Discharge Orders     None         DLucrezia Starch MD 07/01/21 [phone]

## 2021-07-08 ENCOUNTER — Encounter (HOSPITAL_BASED_OUTPATIENT_CLINIC_OR_DEPARTMENT_OTHER): Payer: Self-pay | Admitting: Urology

## 2021-07-08 ENCOUNTER — Other Ambulatory Visit: Payer: Self-pay

## 2021-07-08 ENCOUNTER — Emergency Department (HOSPITAL_BASED_OUTPATIENT_CLINIC_OR_DEPARTMENT_OTHER)
Admission: EM | Admit: 2021-07-08 | Discharge: 2021-07-09 | Disposition: A | Discharge status: Home / Self Care | Payer: Medicare Other | Attending: Emergency Medicine | Admitting: Emergency Medicine

## 2021-07-08 ENCOUNTER — Emergency Department (HOSPITAL_BASED_OUTPATIENT_CLINIC_OR_DEPARTMENT_OTHER): Payer: Medicare Other

## 2021-07-08 DIAGNOSIS — J449 Chronic obstructive pulmonary disease, unspecified: Secondary | ICD-10-CM | POA: Diagnosis not present

## 2021-07-08 DIAGNOSIS — R002 Palpitations: Secondary | ICD-10-CM | POA: Diagnosis present

## 2021-07-08 DIAGNOSIS — R0789 Other chest pain: Secondary | ICD-10-CM | POA: Diagnosis not present

## 2021-07-08 DIAGNOSIS — Z7982 Long term (current) use of aspirin: Secondary | ICD-10-CM | POA: Insufficient documentation

## 2021-07-08 LAB — CBC
HCT: 39.5 % (ref 39.0–52.0)
Hemoglobin: 13.2 g/dL (ref 13.0–17.0)
MCH: 28.4 pg (ref 26.0–34.0)
MCHC: 33.4 g/dL (ref 30.0–36.0)
MCV: 85.1 fL (ref 80.0–100.0)
Platelets: 291 10*3/uL (ref 150–400)
RBC: 4.64 MIL/uL (ref 4.22–5.81)
RDW: 14.6 % (ref 11.5–15.5)
WBC: 6.9 10*3/uL (ref 4.0–10.5)
nRBC: 0 % (ref 0.0–0.2)

## 2021-07-08 LAB — BASIC METABOLIC PANEL
Anion gap: 8 (ref 5–15)
BUN: 18 mg/dL (ref 8–23)
CO2: 27 mmol/L (ref 22–32)
Calcium: 9.4 mg/dL (ref 8.9–10.3)
Chloride: 106 mmol/L (ref 98–111)
Creatinine, Ser: 1.57 mg/dL — ABNORMAL HIGH (ref 0.61–1.24)
GFR, Estimated: 49 mL/min — ABNORMAL LOW (ref >60)
Glucose, Bld: 114 mg/dL — ABNORMAL HIGH (ref 70–99)
Potassium: 4 mmol/L (ref 3.5–5.1)
Sodium: 141 mmol/L (ref 135–145)

## 2021-07-08 LAB — TROPONIN I (HIGH SENSITIVITY): Troponin I (High Sensitivity): 3 ng/L (ref <18)

## 2021-07-08 NOTE — ED Triage Notes (Signed)
Left sided chest pain under left arm that started yesterday  Reports SOB Denies N/V  States dry cough   H/o Afib

## 2021-07-09 DIAGNOSIS — R002 Palpitations: Secondary | ICD-10-CM | POA: Diagnosis not present

## 2021-07-09 LAB — TROPONIN I (HIGH SENSITIVITY): Troponin I (High Sensitivity): 3 ng/L (ref <18)

## 2021-07-09 NOTE — Discharge Instructions (Signed)
Continue medications as previously prescribed.  Take ibuprofen 600 mg every 6 hours as needed for pain.  Return to the emergency department if symptoms significantly worsen or change.

## 2021-07-09 NOTE — ED Provider Notes (Signed)
[place] EMERGENCY DEPARTMENT Provider Note   CSN: [id number] Arrival date & time: 07/08/21  2259     History  Chief Complaint  Patient presents with   Chest Pain    Martin Kaiser is a 66 y.o. male.  Patient is a 66 year old male with past medical history of paroxysmal atrial fibrillation, COPD, and recent surgery to remove a renal cyst/tumor.  Patient presenting today with complaints of chest discomfort and palpitations.  This started yesterday evening while he was trying to sleep.  He describes palpitations and what he described as "indigestion".  This kept him up through the night.  He seemed fine throughout the day today, then symptoms returned when he attempted to lie down.  He denies shortness of breath, leg swelling, fevers, chills, or cough.  He denies recent exertional symptoms and has been mowing the grass without any issues.  The history is provided by the patient.      Home Medications Prior to Admission medications   Medication Sig Start Date End Date Taking? Authorizing Provider  acetaminophen (TYLENOL) 500 MG tablet Take 1,000 mg by mouth every 6 (six) hours as needed (pain). Patient not taking: Reported on 05/09/2021    [provider]  albuterol (VENTOLIN HFA) 108 (90 Base) MCG/ACT inhaler Inhale 2 puffs into the lungs 2 (two) times daily as needed for wheezing or shortness of breath.    [provider]  aspirin EC 81 MG tablet Take 81 mg by mouth every morning. Swallow whole.    [provider]  cetirizine (ZYRTEC) 10 MG tablet Take 10 mg by mouth every morning. 03/24/21   [provider]  ciprofloxacin (CILOXAN) 0.3 % ophthalmic solution Place 1 drop into both eyes every 2 (two) hours. Administer 1 drop, every 2 hours, while awake, for 2 days. Then 1 drop, every 4 hours, while awake, for the next 5 days. Patient taking differently: Place 1 drop into both eyes See admin instructions. Ordered 04/26/21:  Administer 1 drop  into both eyes every 2 hours, while awake, for 2 days. Then 1 drop into both eyes every 4 hours, while awake, for the next 5 days. 04/26/21   Harris, Abigail, PA-C  diphenhydramine-acetaminophen (TYLENOL PM) 25-500 MG TABS tablet Take 1 tablet by mouth at bedtime as needed (pain/sleep). Patient not taking: Reported on 05/09/2021    [provider]  docusate sodium (COLACE) 100 MG capsule Take 1 capsule (100 mg total) by mouth 2 (two) times daily. Patient taking differently: Take 100 mg by mouth 2 (two) times daily as needed (constipation). 03/17/21   Debbrah Alar, PA-C  esomeprazole (NEXIUM) 20 MG capsule Take 20 mg by mouth daily as needed (acid reflux).    [provider]  fluticasone (FLONASE) 50 MCG/ACT nasal spray Place 1 spray into both nostrils 3 (three) times daily as needed for allergies or rhinitis.    [provider]  fluticasone-salmeterol (ADVAIR) 250-50 MCG/ACT AEPB Inhale 1 puff into the lungs in the morning and at bedtime.    [provider]  gabapentin (NEURONTIN) 100 MG capsule Take 100 mg by mouth 2 (two) times daily. 12/21/20   [provider]  ipratropium-albuterol (DUONEB) 0.5-2.5 (3) MG/3ML SOLN Take 3 mLs by nebulization See admin instructions. Inhale 3 mls into the lungs twice daily - morning and noon 02/22/18   [provider]  ketotifen (ZADITOR) 0.025 % ophthalmic solution Place 1 drop into both eyes 2 (two) times daily. Patient not taking: Reported on 05/09/2021 04/26/21  Harris, Abigail, PA-C  metoprolol tartrate (LOPRESSOR) 25 MG tablet Take 12.5 mg by mouth 2 (two) times daily. 01/10/21   [provider]  Multiple Vitamin (MULTIVITAMIN WITH MINERALS) TABS tablet Take 1 tablet by mouth every morning.    [provider]  OXYGEN Inhale 2 L into the lungs See admin instructions. Whenever napping or sleeping    [provider]  polyvinyl alcohol (LIQUIFILM TEARS) 1.4 % ophthalmic solution Place 1 drop  into both eyes as needed for dry eyes.    [provider]  pravastatin (PRAVACHOL) 40 MG tablet Take 40 mg by mouth at bedtime. 12/21/20   [provider]  sucralfate (CARAFATE) 1 g tablet Take 1 g by mouth 2 (two) times daily.    [provider]      Allergies    Tramadol    Review of Systems   Review of Systems  All other systems reviewed and are negative.  Physical Exam Updated Vital Signs BP 115/68   Pulse 69   Temp 98.3 F (36.8 C) (Oral)   Resp (!) 25   Ht '5\' 6"'$  (1.676 m)   Wt 98 kg   SpO2 98%   BMI 34.86 kg/m  Physical Exam Vitals and nursing note reviewed.  Constitutional:      General: He is not in acute distress.    Appearance: He is well-developed. He is not diaphoretic.  HENT:     Head: Normocephalic and atraumatic.  Cardiovascular:     Rate and Rhythm: Normal rate and regular rhythm.     Heart sounds: No murmur heard.   No friction rub.  Pulmonary:     Effort: Pulmonary effort is normal. No respiratory distress.     Breath sounds: Normal breath sounds. No wheezing or rales.  Abdominal:     General: Bowel sounds are normal. There is no distension.     Palpations: Abdomen is soft.     Tenderness: There is no abdominal tenderness.  Musculoskeletal:        General: Normal range of motion.     Cervical back: Normal range of motion and neck supple.     Right lower leg: No tenderness. No edema.     Left lower leg: No tenderness. No edema.  Skin:    General: Skin is warm and dry.  Neurological:     Mental Status: He is alert and oriented to person, place, and time.     Coordination: Coordination normal.    ED Results / Procedures / Treatments   Labs (all labs ordered are listed, but only abnormal results are displayed) Labs Reviewed  BASIC METABOLIC PANEL - Abnormal; Notable for the following components:      Result Value   Glucose, Bld 114 (*)    Creatinine, Ser 1.57 (*)    GFR, Estimated 49 (*)    All other components  within normal limits  CBC  TROPONIN I (HIGH SENSITIVITY)  TROPONIN I (HIGH SENSITIVITY)    EKG EKG Interpretation  Date/Time:  Friday July 08 2021 23:08:30 EDT Ventricular Rate:  70 PR Interval:  164 QRS Duration: 80 QT Interval:  370 QTC Calculation: 399 R Axis:   37 Text Interpretation: Normal sinus rhythm Normal ECG When compared with ECG of 08-May-2021 21:50, no significant change is noted Confirmed by Veryl Speak [phone]) on 07/09/2021 1:14:05 AM  Radiology DG Chest 2 View  Result Date: 07/08/2021 CLINICAL DATA:  Left-sided chest pain. EXAM: CHEST - 2 VIEW COMPARISON:  May 08, 2021 FINDINGS: The heart size and mediastinal contours are within normal limits. Mild linear atelectasis is seen within the left lung base. Both lungs are otherwise clear. The visualized skeletal structures are unremarkable. IMPRESSION: Mild left basilar linear atelectasis. Electronically Signed   By: Virgina Norfolk M.D.   On: 07/08/2021 23:54    Procedures Procedures    Medications Ordered in ED Medications - No data to display  ED Course/ Medical Decision Making/ A&P  This patient presents to the ED for concern of chest discomfort and palpitations, this involves an extensive number of treatment options, and is a complaint that carries with it a high risk of complications and morbidity.  The differential diagnosis includes atrial fibrillation, acute coronary syndrome, pulmonary embolism, aortic dissection, GERD, musculoskeletal etiology   Co morbidities that complicate the patient evaluation  None   Additional history obtained:  No additional history or external records needed   Lab Tests:  I Ordered, and personally interpreted labs.  The pertinent results include: Unremarkable CBC, metabolic panel, and troponin x2   Imaging Studies ordered:  I ordered imaging studies including chest x-ray I independently visualized and interpreted imaging which showed no acute process I agree with  the radiologist interpretation   Cardiac Monitoring: / EKG:  The patient was maintained on a cardiac monitor.  I personally viewed and interpreted the cardiac monitored which showed an underlying rhythm of: Sinus   Consultations Obtained:  No consultations needed   Problem List / ED Course / Critical interventions / Medication management  Patient presenting with palpitations and chest discomfort as described in the HPI.  Patient is in normal sinus rhythm here with negative cardiac work-up.  His EKG is unchanged and troponin x2 is negative.  Symptoms may well be related to reflux, but it does not appear to be cardiac in nature.  Patient seems appropriate for discharge with outpatient follow-up. I have reviewed the patients home medicines and have made adjustments as needed   Social Determinants of Health:  None   Test / Admission - Considered:  Patient to be discharged to home with follow-up with his cardiologist if symptoms persist.  He is to return as needed if symptoms worsen.   Final Clinical Impression(s) / ED Diagnoses Final diagnoses:  None    Rx / DC Orders ED Discharge Orders     None         Veryl Speak, MD 07/09/21 [phone]

## 2021-08-10 IMAGING — DX DG CHEST 2V
2 series · 2 of 2 positions shown · non-contrast
Comparison: 07/03/2019

CLINICAL DATA: Short of breath, asthma

EXAM:
CHEST - 2 VIEW

[chest pa]
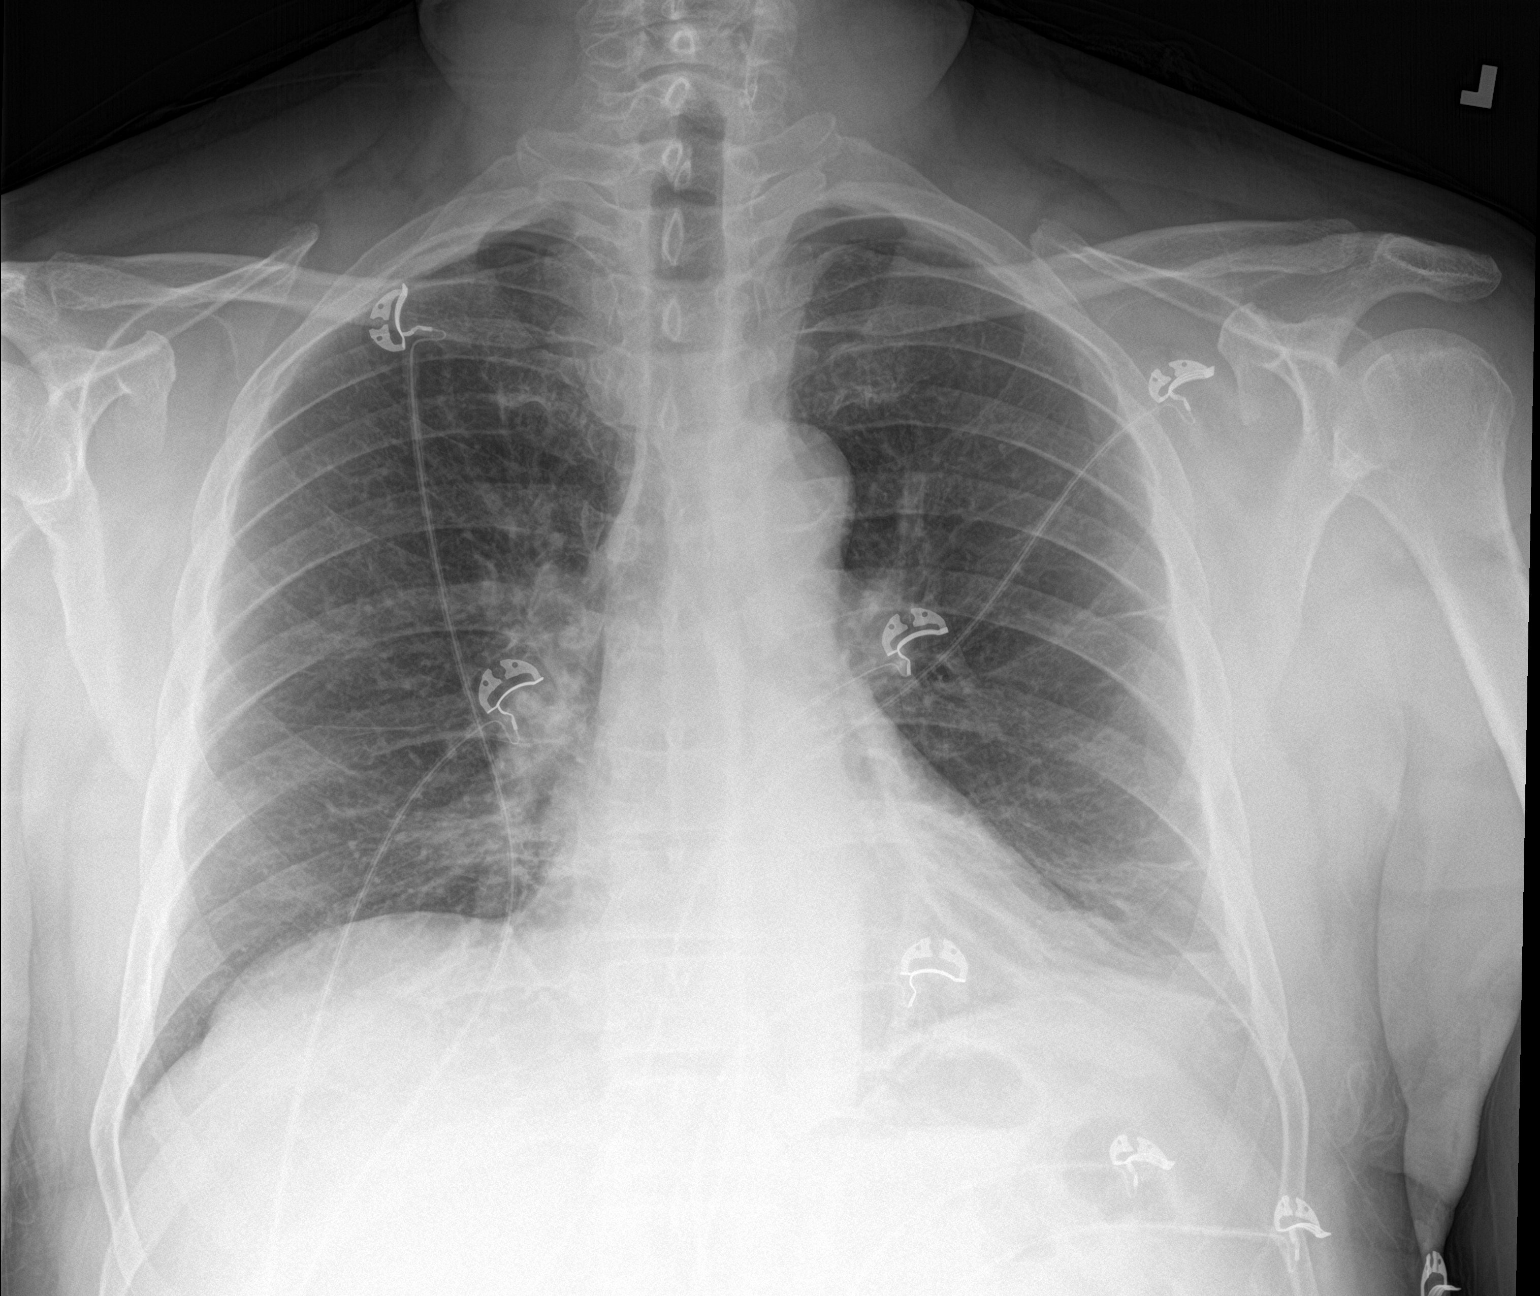

[chest ap]
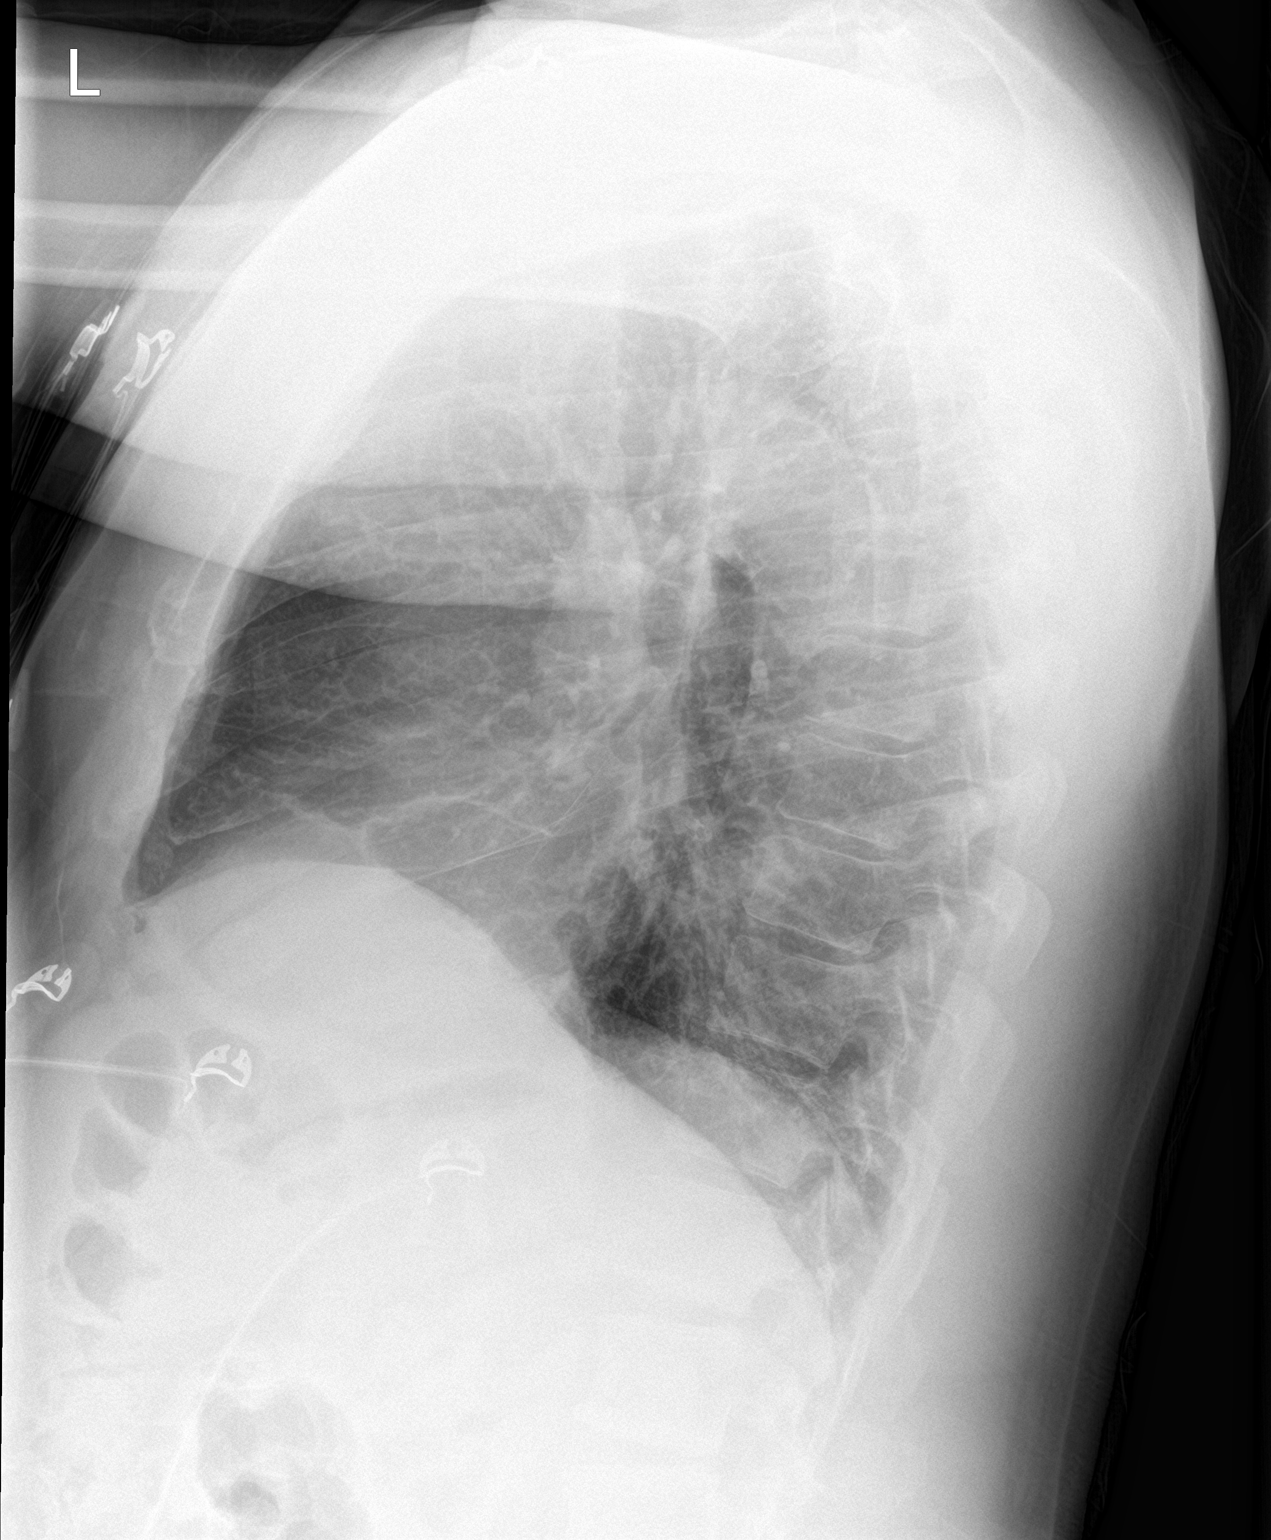

[2 of 2 positions shown; findings below may reference images not displayed]

FINDINGS: Frontal and lateral views of the chest demonstrate an unremarkable
cardiac silhouette. Chronic eventration of the right hemidiaphragm.
Chronic scarring at the left lung base. No airspace disease,
effusion, or pneumothorax. No acute bony abnormalities.
IMPRESSION: 1. No acute intrathoracic process.

## 2021-10-10 ENCOUNTER — Emergency Department (HOSPITAL_BASED_OUTPATIENT_CLINIC_OR_DEPARTMENT_OTHER)
Admission: EM | Admit: 2021-10-10 | Discharge: 2021-10-10 | Disposition: A | Discharge status: Home / Self Care | Payer: Medicare Other | Attending: Emergency Medicine | Admitting: Emergency Medicine

## 2021-10-10 ENCOUNTER — Encounter (HOSPITAL_BASED_OUTPATIENT_CLINIC_OR_DEPARTMENT_OTHER): Payer: Self-pay | Admitting: Emergency Medicine

## 2021-10-10 ENCOUNTER — Other Ambulatory Visit: Payer: Self-pay

## 2021-10-10 DIAGNOSIS — Z7951 Long term (current) use of inhaled steroids: Secondary | ICD-10-CM | POA: Insufficient documentation

## 2021-10-10 DIAGNOSIS — T24012A Burn of unspecified degree of left thigh, initial encounter: Secondary | ICD-10-CM | POA: Diagnosis present

## 2021-10-10 DIAGNOSIS — Z87891 Personal history of nicotine dependence: Secondary | ICD-10-CM | POA: Diagnosis not present

## 2021-10-10 DIAGNOSIS — L03311 Cellulitis of abdominal wall: Secondary | ICD-10-CM | POA: Insufficient documentation

## 2021-10-10 DIAGNOSIS — Z7982 Long term (current) use of aspirin: Secondary | ICD-10-CM | POA: Diagnosis not present

## 2021-10-10 DIAGNOSIS — T2112XA Burn of first degree of abdominal wall, initial encounter: Secondary | ICD-10-CM | POA: Diagnosis not present

## 2021-10-10 DIAGNOSIS — J45909 Unspecified asthma, uncomplicated: Secondary | ICD-10-CM | POA: Diagnosis not present

## 2021-10-10 DIAGNOSIS — L0390 Cellulitis, unspecified: Secondary | ICD-10-CM

## 2021-10-10 DIAGNOSIS — L03116 Cellulitis of left lower limb: Secondary | ICD-10-CM | POA: Diagnosis not present

## 2021-10-10 DIAGNOSIS — Z79899 Other long term (current) drug therapy: Secondary | ICD-10-CM | POA: Diagnosis not present

## 2021-10-10 DIAGNOSIS — L03114 Cellulitis of left upper limb: Secondary | ICD-10-CM | POA: Insufficient documentation

## 2021-10-10 DIAGNOSIS — X101XXA Contact with hot food, initial encounter: Secondary | ICD-10-CM | POA: Insufficient documentation

## 2021-10-10 DIAGNOSIS — T22112A Burn of first degree of left forearm, initial encounter: Secondary | ICD-10-CM | POA: Diagnosis not present

## 2021-10-10 DIAGNOSIS — Z859 Personal history of malignant neoplasm, unspecified: Secondary | ICD-10-CM | POA: Diagnosis not present

## 2021-10-10 DIAGNOSIS — J441 Chronic obstructive pulmonary disease with (acute) exacerbation: Secondary | ICD-10-CM | POA: Diagnosis not present

## 2021-10-10 DIAGNOSIS — T24112A Burn of first degree of left thigh, initial encounter: Secondary | ICD-10-CM | POA: Insufficient documentation

## 2021-10-10 MED ORDER — CEPHALEXIN 500 MG PO CAPS: 500.0000 mg | ORAL_CAPSULE | Freq: Four times a day (QID) | ORAL | 0 refills | Status: AC

## 2021-10-10 MED ORDER — ACETAMINOPHEN 500 MG PO TABS
1000.0000 mg | ORAL_TABLET | Freq: Once | ORAL | Status: AC
  Administered 2021-10-10: 1000 mg via ORAL
  Filled 2021-10-10: qty 2
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
## 2021-10-12 ENCOUNTER — Ambulatory Visit (HOSPITAL_COMMUNITY)
Admission: RE | Admit: 2021-10-12 | Discharge: 2021-10-12 | Disposition: A | Discharge status: Home / Self Care | Payer: Medicare Other | Source: Ambulatory Visit | Attending: Urology | Admitting: Urology

## 2021-10-12 ENCOUNTER — Other Ambulatory Visit (HOSPITAL_COMMUNITY): Payer: Self-pay | Admitting: Urology

## 2021-10-12 DIAGNOSIS — C642 Malignant neoplasm of left kidney, except renal pelvis: Secondary | ICD-10-CM | POA: Insufficient documentation

## 2021-10-29 ENCOUNTER — Encounter (HOSPITAL_BASED_OUTPATIENT_CLINIC_OR_DEPARTMENT_OTHER): Payer: Self-pay

## 2021-10-29 ENCOUNTER — Emergency Department (HOSPITAL_BASED_OUTPATIENT_CLINIC_OR_DEPARTMENT_OTHER)
Admission: EM | Admit: 2021-10-29 | Discharge: 2021-10-29 | Disposition: A | Discharge status: Home / Self Care | Payer: Medicare Other | Attending: Emergency Medicine | Admitting: Emergency Medicine

## 2021-10-29 ENCOUNTER — Other Ambulatory Visit: Payer: Self-pay

## 2021-10-29 DIAGNOSIS — Z7951 Long term (current) use of inhaled steroids: Secondary | ICD-10-CM | POA: Insufficient documentation

## 2021-10-29 DIAGNOSIS — I10 Essential (primary) hypertension: Secondary | ICD-10-CM | POA: Insufficient documentation

## 2021-10-29 DIAGNOSIS — Z79899 Other long term (current) drug therapy: Secondary | ICD-10-CM | POA: Insufficient documentation

## 2021-10-29 DIAGNOSIS — L03319 Cellulitis of trunk, unspecified: Secondary | ICD-10-CM | POA: Diagnosis present

## 2021-10-29 DIAGNOSIS — J45909 Unspecified asthma, uncomplicated: Secondary | ICD-10-CM | POA: Diagnosis not present

## 2021-10-29 DIAGNOSIS — Z7982 Long term (current) use of aspirin: Secondary | ICD-10-CM | POA: Diagnosis not present

## 2021-10-29 LAB — URINALYSIS, ROUTINE W REFLEX MICROSCOPIC
Bilirubin Urine: NEGATIVE
Glucose, UA: NEGATIVE mg/dL
Hgb urine dipstick: NEGATIVE
Ketones, ur: NEGATIVE mg/dL
Leukocytes,Ua: NEGATIVE
Nitrite: NEGATIVE
Protein, ur: NEGATIVE mg/dL
Specific Gravity, Urine: 1.015 (ref 1.005–1.030)
pH: 6.5 (ref 5.0–8.0)

## 2021-10-29 MED ORDER — MUPIROCIN 2 % EX OINT: 1.0000 | TOPICAL_OINTMENT | Freq: Two times a day (BID) | CUTANEOUS | 0 refills | Status: AC

## 2021-10-29 MED ORDER — CEPHALEXIN 250 MG PO CAPS
500.0000 mg | ORAL_CAPSULE | Freq: Once | ORAL | Status: AC
  Administered 2021-10-29: 500 mg via ORAL
  Filled 2021-10-29: qty 2

## 2021-10-29 MED ORDER — DOXYCYCLINE HYCLATE 100 MG PO CAPS: 100.0000 mg | ORAL_CAPSULE | Freq: Two times a day (BID) | ORAL | 0 refills | Status: AC
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
## 2021-11-23 ENCOUNTER — Encounter (HOSPITAL_BASED_OUTPATIENT_CLINIC_OR_DEPARTMENT_OTHER): Payer: Self-pay | Admitting: Emergency Medicine

## 2021-11-23 ENCOUNTER — Other Ambulatory Visit: Payer: Self-pay

## 2021-11-23 ENCOUNTER — Emergency Department (HOSPITAL_BASED_OUTPATIENT_CLINIC_OR_DEPARTMENT_OTHER)
Admission: EM | Admit: 2021-11-23 | Discharge: 2021-11-23 | Disposition: A | Discharge status: Home / Self Care | Payer: Medicare Other | Attending: Emergency Medicine | Admitting: Emergency Medicine

## 2021-11-23 ENCOUNTER — Other Ambulatory Visit (HOSPITAL_BASED_OUTPATIENT_CLINIC_OR_DEPARTMENT_OTHER): Payer: Self-pay

## 2021-11-23 DIAGNOSIS — Z5189 Encounter for other specified aftercare: Secondary | ICD-10-CM

## 2021-11-23 DIAGNOSIS — Z7982 Long term (current) use of aspirin: Secondary | ICD-10-CM | POA: Insufficient documentation

## 2021-11-23 DIAGNOSIS — Z4800 Encounter for change or removal of nonsurgical wound dressing: Secondary | ICD-10-CM | POA: Diagnosis present

## 2021-11-23 MED ORDER — "XEROFORM OIL EMULSION 2""X2"" EX PADS": 1.0000 | MEDICATED_PAD | Freq: Two times a day (BID) | CUTANEOUS | 1 refills | Status: AC

## 2021-11-23 MED ORDER — BACITRACIN ZINC 500 UNIT/GM EX OINT
1.0000 | TOPICAL_OINTMENT | Freq: Two times a day (BID) | CUTANEOUS | 0 refills | Status: AC
  Filled 2021-11-23: qty 85.2, 47d supply, fill #0
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
## 2021-11-23 NOTE — Discharge Instructions (Addendum)
Keep area clean with warm soap/water.  Keep moist with bacitracin ointment. Cover with xeroform bandage.  Follow up with your burn doctor.
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
## 2022-01-03 ENCOUNTER — Other Ambulatory Visit: Payer: Self-pay

## 2022-01-03 ENCOUNTER — Emergency Department (HOSPITAL_BASED_OUTPATIENT_CLINIC_OR_DEPARTMENT_OTHER)
Admission: EM | Admit: 2022-01-03 | Discharge: 2022-01-03 | Disposition: A | Discharge status: Home / Self Care | Payer: Medicare Other | Attending: Emergency Medicine | Admitting: Emergency Medicine

## 2022-01-03 DIAGNOSIS — Z7982 Long term (current) use of aspirin: Secondary | ICD-10-CM | POA: Diagnosis not present

## 2022-01-03 DIAGNOSIS — R21 Rash and other nonspecific skin eruption: Secondary | ICD-10-CM | POA: Diagnosis present

## 2022-01-03 MED ORDER — DIPHENHYDRAMINE HCL 25 MG PO CAPS
25.0000 mg | ORAL_CAPSULE | Freq: Once | ORAL | Status: AC
  Administered 2022-01-03: 25 mg via ORAL
  Filled 2022-01-03: qty 1

## 2022-01-03 MED ORDER — DIPHENHYDRAMINE HCL 25 MG PO TABS: 25.0000 mg | ORAL_TABLET | Freq: Four times a day (QID) | ORAL | 0 refills | Status: AC | PRN
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
## 2022-10-10 ENCOUNTER — Emergency Department (HOSPITAL_BASED_OUTPATIENT_CLINIC_OR_DEPARTMENT_OTHER): Payer: 59

## 2022-10-10 ENCOUNTER — Emergency Department (HOSPITAL_BASED_OUTPATIENT_CLINIC_OR_DEPARTMENT_OTHER)
Admission: EM | Admit: 2022-10-10 | Discharge: 2022-10-10 | Disposition: A | Discharge status: Home / Self Care | Payer: 59 | Attending: Emergency Medicine | Admitting: Emergency Medicine

## 2022-10-10 ENCOUNTER — Encounter (HOSPITAL_BASED_OUTPATIENT_CLINIC_OR_DEPARTMENT_OTHER): Payer: Self-pay

## 2022-10-10 ENCOUNTER — Other Ambulatory Visit: Payer: Self-pay

## 2022-10-10 DIAGNOSIS — J45909 Unspecified asthma, uncomplicated: Secondary | ICD-10-CM | POA: Diagnosis not present

## 2022-10-10 DIAGNOSIS — Z7982 Long term (current) use of aspirin: Secondary | ICD-10-CM | POA: Diagnosis not present

## 2022-10-10 DIAGNOSIS — R519 Headache, unspecified: Secondary | ICD-10-CM | POA: Insufficient documentation

## 2022-10-10 DIAGNOSIS — Z853 Personal history of malignant neoplasm of breast: Secondary | ICD-10-CM | POA: Diagnosis not present

## 2022-10-10 DIAGNOSIS — K644 Residual hemorrhoidal skin tags: Secondary | ICD-10-CM | POA: Insufficient documentation

## 2022-10-10 DIAGNOSIS — R42 Dizziness and giddiness: Secondary | ICD-10-CM | POA: Diagnosis not present

## 2022-10-10 DIAGNOSIS — Z7951 Long term (current) use of inhaled steroids: Secondary | ICD-10-CM | POA: Insufficient documentation

## 2022-10-10 LAB — BASIC METABOLIC PANEL
Anion gap: 9 (ref 5–15)
BUN: 14 mg/dL (ref 8–23)
CO2: 26 mmol/L (ref 22–32)
Calcium: 8.9 mg/dL (ref 8.9–10.3)
Chloride: 105 mmol/L (ref 98–111)
Creatinine, Ser: 1.26 mg/dL — ABNORMAL HIGH (ref 0.61–1.24)
GFR, Estimated: >60 mL/min (ref >60)
Glucose, Bld: 98 mg/dL (ref 70–99)
Potassium: 4.7 mmol/L (ref 3.5–5.1)
Sodium: 140 mmol/L (ref 135–145)

## 2022-10-10 LAB — CBC
HCT: 36.2 % — ABNORMAL LOW (ref 39.0–52.0)
Hemoglobin: 11.6 g/dL — ABNORMAL LOW (ref 13.0–17.0)
MCH: 27.6 pg (ref 26.0–34.0)
MCHC: 32 g/dL (ref 30.0–36.0)
MCV: 86 fL (ref 80.0–100.0)
Platelets: 278 10*3/uL (ref 150–400)
RBC: 4.21 MIL/uL — ABNORMAL LOW (ref 4.22–5.81)
RDW: 13.2 % (ref 11.5–15.5)
WBC: 5.1 10*3/uL (ref 4.0–10.5)
nRBC: 0 % (ref 0.0–0.2)

## 2022-10-10 LAB — CBG MONITORING, ED: Glucose-Capillary: 92 mg/dL (ref 70–99)

## 2022-10-10 MED ORDER — HYDROCORTISONE (PERIANAL) 2.5 % EX CREA
1.0000 | TOPICAL_CREAM | Freq: Two times a day (BID) | CUTANEOUS | 0 refills | Status: AC
Start: 2022-10-10 — End: ?

## 2022-10-10 MED ORDER — ACETAMINOPHEN 500 MG PO TABS
1000.0000 mg | ORAL_TABLET | Freq: Once | ORAL | Status: AC
  Administered 2022-10-10: 1000 mg via ORAL
  Filled 2022-10-10: qty 2

## 2022-10-10 NOTE — ED Notes (Signed)
Patient transported to CT 

## 2022-10-10 NOTE — Discharge Instructions (Addendum)
Your CT scan was reassuring today. Please take tylenol/ibuprofen every 6 hours for pain. I recommend close follow-up with PCP for reevaluation.  Please do not hesitate to return to emergency department if worrisome signs symptoms we discussed become apparent.
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
## 2022-10-10 NOTE — ED Provider Notes (Signed)
EMERGENCY DEPARTMENT AT [facility] Provider Note   CSN: [id number] Arrival date & time: 10/10/22  7829     History  Chief Complaint  Patient presents with   Headache   Rectal Complaint    Martin Kaiser is a 67 y.o. male history of A-fib, asthma, gout presents today for evaluation of headache and lightheadedness.  Patient reports that he says he has been going on for couple months but had not gotten worse in the last 3 days.  States that the pain is worse when he bends down.  He also reports some lightheadedness denies any room spinning sensation.  He denies any vision changes, numbness on his face arms or legs, nausea, vomiting, chest pain, shortness of breath, abdominal pain, bowel changes or urinary symptoms.  Patient states that he has been trying Tylenol at home with minimal relief.  He denies any recent fall or head trauma.   Headache   Past Medical History:  Diagnosis Date   Arthritis    Asthma    Atrial fibrillation (HCC)    Cancer (HCC)    Dyspnea    ON OCCAS   Gout    Peripheral vascular disease (HCC)    Pneumonia    Past Surgical History:  Procedure Laterality Date   HEMORRHOID SURGERY     ROBOTIC ASSITED PARTIAL NEPHRECTOMY Left 03/17/2021   Procedure: XI ROBOTIC ASSITED PARTIAL NEPHRECTOMY;  Surgeon: Heloise Purpura, MD;  Location: [facility];  Service: Urology;  Laterality: Left;     Home Medications Prior to Admission medications   Medication Sig Start Date End Date Taking? Authorizing Provider  acetaminophen (TYLENOL) 500 MG tablet Take 1,000 mg by mouth every 6 (six) hours as needed (pain). Patient not taking: Reported on 05/09/2021    [provider]  albuterol (VENTOLIN HFA) 108 (90 Base) MCG/ACT inhaler Inhale 2 puffs into the lungs 2 (two) times daily as needed for wheezing or shortness of breath.    [provider]  aspirin EC 81 MG tablet Take 81 mg by mouth every morning. Swallow whole.    [provider]  bacitracin ointment Apply 1 Application topically 2 (two) times daily. 11/23/21   Gailen Shelter, PA  Bismuth Tribromoph-Petrolatum (XEROFORM OIL EMULSION 2"X2") PADS Apply 1 Pad topically in the morning and at bedtime. 11/23/21   Gailen Shelter, PA  cetirizine (ZYRTEC) 10 MG tablet Take 10 mg by mouth every morning. 03/24/21   [provider]  ciprofloxacin (CILOXAN) 0.3 % ophthalmic solution Place 1 drop into both eyes every 2 (two) hours. Administer 1 drop, every 2 hours, while awake, for 2 days. Then 1 drop, every 4 hours, while awake, for the next 5 days. Patient taking differently: Place 1 drop into both eyes See admin instructions. Ordered 04/26/21:  Administer 1 drop into both eyes every 2 hours, while awake, for 2 days. Then 1 drop into both eyes every 4 hours, while awake, for the next 5 days. 04/26/21   Arthor Captain, PA-C  diphenhydrAMINE (BENADRYL) 25 MG tablet Take 1 tablet (25 mg total) by mouth every 6 (six) hours as needed. 01/03/22   Mesner, Barbara Cower, MD  diphenhydramine-acetaminophen (TYLENOL PM) 25-500 MG TABS tablet Take 1 tablet by mouth at bedtime as needed (pain/sleep). Patient not taking: Reported on 05/09/2021    [provider]  docusate sodium (COLACE) 100 MG capsule Take 1 capsule (100 mg total) by mouth 2 (two) times daily. Patient taking differently: Take 100 mg by  mouth 2 (two) times daily as needed (constipation). 03/17/21   Harrie Foreman, PA-C  doxycycline (VIBRAMYCIN) 100 MG capsule Take 1 capsule (100 mg total) by mouth 2 (two) times daily. 10/29/21   Peter Garter, PA  esomeprazole (NEXIUM) 20 MG capsule Take 20 mg by mouth daily as needed (acid reflux).    [provider]  fluticasone (FLONASE) 50 MCG/ACT nasal spray Place 1 spray into both nostrils 3 (three) times daily as needed for allergies or rhinitis.    [provider]  fluticasone-salmeterol (ADVAIR) 250-50 MCG/ACT AEPB Inhale 1 puff into the lungs in the  morning and at bedtime.    [provider]  gabapentin (NEURONTIN) 100 MG capsule Take 100 mg by mouth 2 (two) times daily. 12/21/20   [provider]  ipratropium-albuterol (DUONEB) 0.5-2.5 (3) MG/3ML SOLN Take 3 mLs by nebulization See admin instructions. Inhale 3 mls into the lungs twice daily - morning and noon 02/22/18   [provider]  ketotifen (ZADITOR) 0.025 % ophthalmic solution Place 1 drop into both eyes 2 (two) times daily. Patient not taking: Reported on 05/09/2021 04/26/21   Arthor Captain, PA-C  metoprolol tartrate (LOPRESSOR) 25 MG tablet Take 12.5 mg by mouth 2 (two) times daily. 01/10/21   [provider]  Multiple Vitamin (MULTIVITAMIN WITH MINERALS) TABS tablet Take 1 tablet by mouth every morning.    [provider]  mupirocin ointment (BACTROBAN) 2 % Apply 1 Application topically 2 (two) times daily. 10/29/21   Peter Garter, PA  OXYGEN Inhale 2 L into the lungs See admin instructions. Whenever napping or sleeping    [provider]  polyvinyl alcohol (LIQUIFILM TEARS) 1.4 % ophthalmic solution Place 1 drop into both eyes as needed for dry eyes.    [provider]  pravastatin (PRAVACHOL) 40 MG tablet Take 40 mg by mouth at bedtime. 12/21/20   [provider]  sucralfate (CARAFATE) 1 g tablet Take 1 g by mouth 2 (two) times daily.    [provider]      Allergies    Tramadol    Review of Systems   Review of Systems  Neurological:  Positive for headaches.    Physical Exam Updated Vital Signs BP (!) 127/90 (BP Location: Right Arm)   Pulse 62   Temp 98 F (36.7 C) (Oral)   Resp 16   Ht 5\' 6"  (1.676 m)   Wt 88.9 kg   SpO2 97%   BMI 31.64 kg/m  Physical Exam Vitals and nursing note reviewed.  Constitutional:      Appearance: Normal appearance.  HENT:     Head: Normocephalic and atraumatic.     Mouth/Throat:     Mouth: Mucous membranes are moist.  Eyes:     General: No scleral  icterus. Cardiovascular:     Rate and Rhythm: Normal rate and regular rhythm.     Pulses: Normal pulses.     Heart sounds: Normal heart sounds.  Pulmonary:     Effort: Pulmonary effort is normal.     Breath sounds: Normal breath sounds.  Abdominal:     General: Abdomen is flat.     Palpations: Abdomen is soft.     Tenderness: There is no abdominal tenderness.  Genitourinary:    Comments: Rectal exam with RN present throughout examination.  External hemorrhoid noted.  No bleeding. Musculoskeletal:        General: No deformity.  Skin:    General: Skin is warm.  Findings: No rash.  Neurological:     General: No focal deficit present.     Mental Status: He is alert.  Psychiatric:        Mood and Affect: Mood normal.     ED Results / Procedures / Treatments   Labs (all labs ordered are listed, but only abnormal results are displayed) Labs Reviewed  BASIC METABOLIC PANEL  CBC  CBG MONITORING, ED    EKG None  Radiology No results found.  Procedures Procedures    Medications Ordered in ED Medications  acetaminophen (TYLENOL) tablet 1,000 mg (has no administration in time range)    ED Course/ Medical Decision Making/ A&P                                 Medical Decision Making Amount and/or Complexity of Data Reviewed Labs: ordered. Radiology: ordered.  Risk OTC drugs.   This patient presents to the ED for headache, rectal pain, this involves an extensive number of treatment options, and is a complaint that carries with a high risk of complications and morbidity.  The differential diagnosis includes cluster headache, migraine, sinusitis, tension headache, acute glaucoma, cervical artery dissection, CO poisoning, encephalitis, encephalopathy, meningitis, mass, pseudotumor, subarachnoid hemorrhage, temporal arteritis/giant cell arteritis, traumatic intracranial hemorrhage.  This is not an exhaustive list.  Lab tests: I ordered and personally interpreted labs.   The pertinent results include: WBC unremarkable. Hbg 11.6. Platelets unremarkable. Electrolytes unremarkable. BUN, creatinine unremarkable.   Imaging studies: I ordered imaging studies, personally reviewed, interpreted imaging and agree with the radiologist's interpretations. The results include: CT head is negative for any acute intracranial abnormalities.  Problem list/ ED course/ Critical interventions/ Medical management: HPI: See above Vital signs within normal range and stable throughout visit. Laboratory/imaging studies significant for: See above. On physical examination, patient is afebrile and appears in no acute distress.  There was no focal neurological deficit.  Symptoms most consistent with benign headache from either tension type headache vs migraine. No headache red flags. Neurologic exam without evidence of meningismus, AMS, focal neurologic findings so doubt meningitis, encephalitis, stroke. Presentation not consistent with acute intracranial bleed to include SAH (lack of risk factors, headache history). No history of trauma so doubt ICH. Doubt carotid artery dissection given no focal neuro deficits, no neck trauma or recent neck strain. Patient with no signs of increased intracranial pressure or weight loss and history and physical suggest more benign headache so less likely mass effect in brain from tumor or abscess or idiopathic intracranial hypertension. Pain was controlled with tylenol and patient discharged home with PCP follow up.  I will also send Rx of Anusol cream for external hemorrhoid. I have reviewed the patient home medicines and have made adjustments as needed.  Cardiac monitoring/EKG: The patient was maintained on a cardiac monitor.  I personally reviewed and interpreted the cardiac monitor which showed an underlying rhythm of: sinus rhythm.  Additional history obtained: External records from outside source obtained and reviewed including: Chart review including  previous notes, labs, imaging.  Consultations obtained:  Disposition Continued outpatient therapy. Follow-up with PCP recommended for reevaluation of symptoms. Treatment plan discussed with patient.  Pt acknowledged understanding was agreeable to the plan. Worrisome signs and symptoms were discussed with patient, and patient acknowledged understanding to return to the ED if they noticed these signs and symptoms. Patient was stable upon discharge.   This chart was dictated using voice recognition  software.  Despite best efforts to proofread,  errors can occur which can change the documentation meaning.          Final Clinical Impression(s) / ED Diagnoses Final diagnoses:  Nonintractable headache, unspecified chronicity pattern, unspecified headache type  External hemorrhoid    Rx / DC Orders ED Discharge Orders          Ordered    hydrocortisone (ANUSOL-HC) 2.5 % rectal cream  2 times daily        Pending              Jeanelle Malling, PA 10/10/22 1236    Rolan Bucco, MD 10/18/22 1624

## 2023-06-01 ENCOUNTER — Encounter (HOSPITAL_BASED_OUTPATIENT_CLINIC_OR_DEPARTMENT_OTHER): Payer: Self-pay | Admitting: Urology

## 2023-06-01 ENCOUNTER — Emergency Department (HOSPITAL_BASED_OUTPATIENT_CLINIC_OR_DEPARTMENT_OTHER)
Admission: EM | Admit: 2023-06-01 | Discharge: 2023-06-01 | Discharge status: Against Medical Advice | Attending: Emergency Medicine | Admitting: Emergency Medicine

## 2023-06-01 ENCOUNTER — Emergency Department (HOSPITAL_BASED_OUTPATIENT_CLINIC_OR_DEPARTMENT_OTHER)

## 2023-06-01 ENCOUNTER — Other Ambulatory Visit: Payer: Self-pay

## 2023-06-01 DIAGNOSIS — R059 Cough, unspecified: Secondary | ICD-10-CM | POA: Diagnosis not present

## 2023-06-01 DIAGNOSIS — R12 Heartburn: Secondary | ICD-10-CM | POA: Insufficient documentation

## 2023-06-01 DIAGNOSIS — R1110 Vomiting, unspecified: Secondary | ICD-10-CM | POA: Insufficient documentation

## 2023-06-01 DIAGNOSIS — R0602 Shortness of breath: Secondary | ICD-10-CM | POA: Insufficient documentation

## 2023-06-01 DIAGNOSIS — Z5321 Procedure and treatment not carried out due to patient leaving prior to being seen by health care provider: Secondary | ICD-10-CM | POA: Insufficient documentation

## 2023-06-01 NOTE — ED Triage Notes (Signed)
 Pt states bad heartburn with cough and SOB that started 3 days ago  States vomiting last night  Took alkaseltzer with little relief

## 2023-11-14 ENCOUNTER — Emergency Department (HOSPITAL_BASED_OUTPATIENT_CLINIC_OR_DEPARTMENT_OTHER)
Admission: EM | Admit: 2023-11-14 | Discharge: 2023-11-14 | Discharge status: Against Medical Advice | Attending: Emergency Medicine | Admitting: Emergency Medicine

## 2023-11-14 ENCOUNTER — Other Ambulatory Visit: Payer: Self-pay

## 2023-11-14 ENCOUNTER — Encounter (HOSPITAL_BASED_OUTPATIENT_CLINIC_OR_DEPARTMENT_OTHER): Payer: Self-pay | Admitting: Emergency Medicine

## 2023-11-14 DIAGNOSIS — L02412 Cutaneous abscess of left axilla: Secondary | ICD-10-CM | POA: Insufficient documentation

## 2023-11-14 DIAGNOSIS — L02411 Cutaneous abscess of right axilla: Secondary | ICD-10-CM | POA: Insufficient documentation

## 2023-11-14 DIAGNOSIS — Z5321 Procedure and treatment not carried out due to patient leaving prior to being seen by health care provider: Secondary | ICD-10-CM | POA: Diagnosis not present

## 2023-11-14 NOTE — ED Triage Notes (Signed)
 Pt reports abscesses to bil axillae
